# Patient Record
Sex: Female | Born: 1935 | Race: Black or African American | Hispanic: No | Smoking: Former smoker
Health system: Southern US, Community
[De-identification: ages and names within clinical notes are randomized; demographics above are authoritative.]

## PROBLEM LIST (undated history)

## (undated) DIAGNOSIS — Z8619 Personal history of other infectious and parasitic diseases: Secondary | ICD-10-CM

## (undated) DIAGNOSIS — I10 Essential (primary) hypertension: Secondary | ICD-10-CM

## (undated) DIAGNOSIS — K5792 Diverticulitis of intestine, part unspecified, without perforation or abscess without bleeding: Secondary | ICD-10-CM

## (undated) DIAGNOSIS — E7800 Pure hypercholesterolemia, unspecified: Secondary | ICD-10-CM

## (undated) DIAGNOSIS — Z974 Presence of external hearing-aid: Secondary | ICD-10-CM

## (undated) DIAGNOSIS — J349 Unspecified disorder of nose and nasal sinuses: Secondary | ICD-10-CM

## (undated) DIAGNOSIS — IMO0001 Reserved for inherently not codable concepts without codable children: Secondary | ICD-10-CM

## (undated) DIAGNOSIS — Z972 Presence of dental prosthetic device (complete) (partial): Secondary | ICD-10-CM

## (undated) DIAGNOSIS — K219 Gastro-esophageal reflux disease without esophagitis: Secondary | ICD-10-CM

## (undated) DIAGNOSIS — M8580 Other specified disorders of bone density and structure, unspecified site: Secondary | ICD-10-CM

## (undated) DIAGNOSIS — E119 Type 2 diabetes mellitus without complications: Secondary | ICD-10-CM

## (undated) DIAGNOSIS — B019 Varicella without complication: Secondary | ICD-10-CM

## (undated) DIAGNOSIS — D649 Anemia, unspecified: Secondary | ICD-10-CM

## (undated) DIAGNOSIS — M1990 Unspecified osteoarthritis, unspecified site: Secondary | ICD-10-CM

## (undated) HISTORY — PX: EYE SURGERY: SHX253

## (undated) HISTORY — PX: COLONOSCOPY: SHX174

## (undated) HISTORY — PX: BREAST SURGERY: SHX581

## (undated) HISTORY — PX: ABDOMINAL HYSTERECTOMY: SHX81

## (undated) HISTORY — PX: CHOLECYSTECTOMY: SHX55

## (undated) HISTORY — PX: BREAST BIOPSY: SHX20

---

## 2004-06-12 ENCOUNTER — Ambulatory Visit: Payer: Self-pay | Admitting: Internal Medicine

## 2004-11-10 ENCOUNTER — Ambulatory Visit: Payer: Self-pay

## 2005-06-16 ENCOUNTER — Ambulatory Visit: Payer: Self-pay | Admitting: Internal Medicine

## 2006-06-21 ENCOUNTER — Ambulatory Visit: Payer: Self-pay | Admitting: Internal Medicine

## 2006-06-22 ENCOUNTER — Ambulatory Visit: Payer: Self-pay | Admitting: Internal Medicine

## 2006-07-09 ENCOUNTER — Ambulatory Visit: Payer: Self-pay | Admitting: Internal Medicine

## 2007-07-25 ENCOUNTER — Ambulatory Visit: Payer: Self-pay | Admitting: Internal Medicine

## 2008-07-23 ENCOUNTER — Ambulatory Visit: Payer: Self-pay | Admitting: Internal Medicine

## 2008-08-13 ENCOUNTER — Ambulatory Visit: Payer: Self-pay | Admitting: Internal Medicine

## 2009-05-14 ENCOUNTER — Ambulatory Visit: Payer: Self-pay | Admitting: Internal Medicine

## 2009-06-08 ENCOUNTER — Ambulatory Visit: Payer: Self-pay | Admitting: Internal Medicine

## 2009-08-15 ENCOUNTER — Ambulatory Visit: Payer: Self-pay | Admitting: Internal Medicine

## 2010-03-03 ENCOUNTER — Ambulatory Visit: Payer: Self-pay | Admitting: Gastroenterology

## 2010-08-19 ENCOUNTER — Ambulatory Visit: Payer: Self-pay | Admitting: Internal Medicine

## 2010-08-21 ENCOUNTER — Ambulatory Visit: Payer: Self-pay | Admitting: Internal Medicine

## 2011-02-24 ENCOUNTER — Ambulatory Visit: Payer: Self-pay | Admitting: Internal Medicine

## 2011-08-20 ENCOUNTER — Ambulatory Visit: Payer: Self-pay | Admitting: Internal Medicine

## 2012-08-24 ENCOUNTER — Ambulatory Visit: Payer: Self-pay | Admitting: Internal Medicine

## 2012-10-14 ENCOUNTER — Ambulatory Visit: Payer: Self-pay | Admitting: Family Medicine

## 2013-08-25 ENCOUNTER — Ambulatory Visit: Payer: Self-pay | Admitting: Internal Medicine

## 2014-08-24 ENCOUNTER — Ambulatory Visit: Payer: Self-pay | Admitting: Family Medicine

## 2014-08-28 ENCOUNTER — Ambulatory Visit: Payer: Self-pay | Admitting: Internal Medicine

## 2015-01-14 NOTE — Discharge Instructions (Signed)

## 2015-01-16 ENCOUNTER — Ambulatory Visit: Payer: Medicare Other | Admitting: Anesthesiology

## 2015-01-16 ENCOUNTER — Encounter: Payer: Self-pay | Admitting: *Deleted

## 2015-01-16 ENCOUNTER — Encounter
Admission: RE | Disposition: A | Discharge status: Home / Self Care | Payer: Self-pay | Source: Ambulatory Visit | Attending: Ophthalmology

## 2015-01-16 ENCOUNTER — Ambulatory Visit
Admission: RE | Admit: 2015-01-16 | Discharge: 2015-01-16 | Disposition: A | Discharge status: Home / Self Care | Payer: Medicare Other | Source: Ambulatory Visit | Attending: Ophthalmology | Admitting: Ophthalmology

## 2015-01-16 DIAGNOSIS — K5790 Diverticulosis of intestine, part unspecified, without perforation or abscess without bleeding: Secondary | ICD-10-CM | POA: Diagnosis not present

## 2015-01-16 DIAGNOSIS — M1990 Unspecified osteoarthritis, unspecified site: Secondary | ICD-10-CM | POA: Diagnosis not present

## 2015-01-16 DIAGNOSIS — R0602 Shortness of breath: Secondary | ICD-10-CM | POA: Insufficient documentation

## 2015-01-16 DIAGNOSIS — E780 Pure hypercholesterolemia: Secondary | ICD-10-CM | POA: Diagnosis not present

## 2015-01-16 DIAGNOSIS — Z885 Allergy status to narcotic agent status: Secondary | ICD-10-CM | POA: Insufficient documentation

## 2015-01-16 DIAGNOSIS — Z87891 Personal history of nicotine dependence: Secondary | ICD-10-CM | POA: Insufficient documentation

## 2015-01-16 DIAGNOSIS — Z90710 Acquired absence of both cervix and uterus: Secondary | ICD-10-CM | POA: Insufficient documentation

## 2015-01-16 DIAGNOSIS — D649 Anemia, unspecified: Secondary | ICD-10-CM | POA: Insufficient documentation

## 2015-01-16 DIAGNOSIS — H2511 Age-related nuclear cataract, right eye: Secondary | ICD-10-CM | POA: Insufficient documentation

## 2015-01-16 DIAGNOSIS — E119 Type 2 diabetes mellitus without complications: Secondary | ICD-10-CM | POA: Diagnosis not present

## 2015-01-16 DIAGNOSIS — I10 Essential (primary) hypertension: Secondary | ICD-10-CM | POA: Diagnosis not present

## 2015-01-16 DIAGNOSIS — K219 Gastro-esophageal reflux disease without esophagitis: Secondary | ICD-10-CM | POA: Diagnosis not present

## 2015-01-16 DIAGNOSIS — Z882 Allergy status to sulfonamides status: Secondary | ICD-10-CM | POA: Insufficient documentation

## 2015-01-16 DIAGNOSIS — Z9049 Acquired absence of other specified parts of digestive tract: Secondary | ICD-10-CM | POA: Diagnosis not present

## 2015-01-16 HISTORY — DX: Type 2 diabetes mellitus without complications: E11.9

## 2015-01-16 HISTORY — DX: Pure hypercholesterolemia, unspecified: E78.00

## 2015-01-16 HISTORY — DX: Essential (primary) hypertension: I10

## 2015-01-16 HISTORY — DX: Unspecified disorder of nose and nasal sinuses: J34.9

## 2015-01-16 HISTORY — DX: Gastro-esophageal reflux disease without esophagitis: K21.9

## 2015-01-16 HISTORY — DX: Unspecified osteoarthritis, unspecified site: M19.90

## 2015-01-16 HISTORY — DX: Reserved for inherently not codable concepts without codable children: IMO0001

## 2015-01-16 HISTORY — DX: Diverticulitis of intestine, part unspecified, without perforation or abscess without bleeding: K57.92

## 2015-01-16 HISTORY — PX: CATARACT EXTRACTION W/PHACO: SHX586

## 2015-01-16 HISTORY — DX: Anemia, unspecified: D64.9

## 2015-01-16 LAB — GLUCOSE, CAPILLARY
GLUCOSE-CAPILLARY: 123 mg/dL — AB (ref 65–99)
Glucose-Capillary: 132 mg/dL — ABNORMAL HIGH (ref 65–99)

## 2015-01-16 SURGERY — PHACOEMULSIFICATION, CATARACT, WITH IOL INSERTION
Anesthesia: Monitor Anesthesia Care | Laterality: Right | Wound class: Clean

## 2015-01-16 MED ORDER — TIMOLOL MALEATE 0.5 % OP SOLN
OPHTHALMIC | Status: DC | PRN
  Administered 2015-01-16: 1 [drp] via OPHTHALMIC

## 2015-01-16 MED ORDER — BRIMONIDINE TARTRATE 0.2 % OP SOLN
OPHTHALMIC | Status: DC | PRN
  Administered 2015-01-16: 1 [drp] via OPHTHALMIC

## 2015-01-16 MED ORDER — ARMC OPHTHALMIC DILATING GEL
1.0000 "application " | OPHTHALMIC | Status: DC | PRN
  Administered 2015-01-16 (×2): 1 via OPHTHALMIC

## 2015-01-16 MED ORDER — TETRACAINE HCL 0.5 % OP SOLN
1.0000 [drp] | OPHTHALMIC | Status: DC | PRN
  Administered 2015-01-16: 1 [drp] via OPHTHALMIC

## 2015-01-16 MED ORDER — MIDAZOLAM HCL 2 MG/2ML IJ SOLN
INTRAMUSCULAR | Status: DC | PRN
  Administered 2015-01-16: 2 mg via INTRAVENOUS

## 2015-01-16 MED ORDER — EPINEPHRINE HCL 1 MG/ML IJ SOLN
INTRAMUSCULAR | Status: DC | PRN
  Administered 2015-01-16: 86 mL via OPHTHALMIC

## 2015-01-16 MED ORDER — FENTANYL CITRATE (PF) 100 MCG/2ML IJ SOLN
INTRAMUSCULAR | Status: DC | PRN
  Administered 2015-01-16: 50 ug via INTRAVENOUS

## 2015-01-16 MED ORDER — CEFUROXIME OPHTHALMIC INJECTION 1 MG/0.1 ML
INJECTION | OPHTHALMIC | Status: DC | PRN
  Administered 2015-01-16: .3 mL via INTRACAMERAL

## 2015-01-16 MED ORDER — NA HYALUR & NA CHOND-NA HYALUR 0.4-0.35 ML IO KIT
PACK | INTRAOCULAR | Status: DC | PRN
  Administered 2015-01-16: 1 mL via INTRAOCULAR

## 2015-01-16 MED ORDER — POVIDONE-IODINE 5 % OP SOLN
1.0000 "application " | OPHTHALMIC | Status: DC | PRN
  Administered 2015-01-16: 1 via OPHTHALMIC

## 2015-01-16 SURGICAL SUPPLY — 26 items
CANNULA ANT/CHMB 27GA (MISCELLANEOUS) ×3 IMPLANT
GLOVE SURG LX 7.5 STRW (GLOVE) ×2
GLOVE SURG LX STRL 7.5 STRW (GLOVE) ×1 IMPLANT
GLOVE SURG TRIUMPH 8.0 PF LTX (GLOVE) ×3 IMPLANT
GOWN STRL REUS W/ TWL LRG LVL3 (GOWN DISPOSABLE) ×2 IMPLANT
GOWN STRL REUS W/TWL LRG LVL3 (GOWN DISPOSABLE) ×4
LENS IOL TECNIS 24.5 (Intraocular Lens) ×3 IMPLANT
LENS IOL TECNIS MONO 1P 24.5 (Intraocular Lens) ×1 IMPLANT
MARKER SKIN SURG W/RULER VIO (MISCELLANEOUS) ×3 IMPLANT
NDL RETROBULBAR .5 NSTRL (NEEDLE) IMPLANT
NEEDLE FILTER BLUNT 18X 1/2SAF (NEEDLE) ×2
NEEDLE FILTER BLUNT 18X1 1/2 (NEEDLE) ×1 IMPLANT
PACK CATARACT BRASINGTON (MISCELLANEOUS) ×3 IMPLANT
PACK EYE AFTER SURG (MISCELLANEOUS) ×3 IMPLANT
PACK OPTHALMIC (MISCELLANEOUS) ×3 IMPLANT
RING MALYGIN 7.0 (MISCELLANEOUS) IMPLANT
SUT ETHILON 10-0 CS-B-6CS-B-6 (SUTURE)
SUT VICRYL  9 0 (SUTURE)
SUT VICRYL 9 0 (SUTURE) IMPLANT
SUTURE EHLN 10-0 CS-B-6CS-B-6 (SUTURE) IMPLANT
SYR 3ML LL SCALE MARK (SYRINGE) ×3 IMPLANT
SYR 5ML LL (SYRINGE) IMPLANT
SYR TB 1ML LUER SLIP (SYRINGE) ×3 IMPLANT
WATER STERILE IRR 250ML POUR (IV SOLUTION) ×3 IMPLANT
WATER STERILE IRR 500ML POUR (IV SOLUTION) IMPLANT
WIPE NON LINTING 3.25X3.25 (MISCELLANEOUS) ×3 IMPLANT

## 2015-01-16 NOTE — Anesthesia Preprocedure Evaluation (Signed)
Anesthesia Evaluation   Patient awake    Reviewed: Allergy & Precautions, H&P , NPO status , Patient's Chart, lab work & pertinent test results  History of Anesthesia Complications Negative for: history of anesthetic complications  Airway Mallampati: II  TM Distance: >3 FB Neck ROM: full    Dental no notable dental hx.    Pulmonary former smoker,    Pulmonary exam normal       Cardiovascular hypertension, On Medications Normal cardiovascular exam    Neuro/Psych    GI/Hepatic Neg liver ROS, GERD-  Medicated,  Endo/Other  diabetes  Renal/GU negative Renal ROS     Musculoskeletal   Abdominal   Peds  Hematology negative hematology ROS (+)   Anesthesia Other Findings   Reproductive/Obstetrics                             Anesthesia Physical Anesthesia Plan  ASA: II  Anesthesia Plan: MAC   Post-op Pain Management:    Induction:   Airway Management Planned:   Additional Equipment:   Intra-op Plan:   Post-operative Plan:   Informed Consent: I have reviewed the patients History and Physical, chart, labs and discussed the procedure including the risks, benefits and alternatives for the proposed anesthesia with the patient or authorized representative who has indicated his/her understanding and acceptance.     Plan Discussed with: CRNA  Anesthesia Plan Comments:         Anesthesia Quick Evaluation

## 2015-01-16 NOTE — Op Note (Signed)
LOCATION:  [place]   PREOPERATIVE DIAGNOSIS:    Nuclear sclerotic cataract right eye. H25.11   POSTOPERATIVE DIAGNOSIS:  Nuclear sclerotic cataract right eye.     PROCEDURE:  Phacoemusification with posterior chamber intraocular lens placement of the right eye   LENS:   Implant Name Type Inv. Item Serial No. Manufacturer Lot No. LRB No. Used  LENS IMPL INTRAOC ZCB00 24.5 - [id number] Intraocular Lens LENS IMPL INTRAOC ZCB00 24.5 [id number] AMO   Right 1        ULTRASOUND TIME: 14 % of 1 minutes, 32 seconds.  CDE 12.9   SURGEON:  Wyonia Hough, MD   ANESTHESIA:  Topical with tetracaine drops and 2% Xylocaine jelly.   COMPLICATIONS:  None.   DESCRIPTION OF PROCEDURE:  The patient was identified in the holding room and transported to the operating room and placed in the supine position under the operating microscope.  The right eye was identified as the operative eye and it was prepped and draped in the usual sterile ophthalmic fashion.   A 1 millimeter clear-corneal paracentesis was made at the 12:00 position.  The anterior chamber was filled with Viscoat viscoelastic.  A 2.4 millimeter keratome was used to make a near-clear corneal incision at the 9:00 position.  A curvilinear capsulorrhexis was made with a cystotome and capsulorrhexis forceps.  Balanced salt solution was used to hydrodissect and hydrodelineate the nucleus.   Phacoemulsification was then used in stop and chop fashion to remove the lens nucleus and epinucleus.  The remaining cortex was then removed using the irrigation and aspiration handpiece. Provisc was then placed into the capsular bag to distend it for lens placement.  A lens was then injected into the capsular bag.  The remaining viscoelastic was aspirated.   Wounds were hydrated with balanced salt solution.  The anterior chamber was inflated to a physiologic pressure with balanced salt solution.  No wound leaks were noted. Cefuroxime 0.1 ml of a  10mg /ml solution was injected into the anterior chamber for a dose of 1 mg of intracameral antibiotic at the completion of the case.   Timolol and Brimonidine drops were applied to the eye.  The patient was taken to the recovery room in stable condition without complications of anesthesia or surgery.   Jean Skow 01/16/2015, 8:32 AM

## 2015-01-16 NOTE — Anesthesia Postprocedure Evaluation (Signed)
  Anesthesia Post-op Note  Patient: Gina Carney  Procedure(s) Performed: Procedure(s) with comments: CATARACT EXTRACTION PHACO AND INTRAOCULAR LENS PLACEMENT (IOC) (Right) - DIABETIC-oral meds  Anesthesia type:MAC  Patient location: PACU  Post pain: Pain level controlled  Post assessment: Post-op Vital signs reviewed, Patient's Cardiovascular Status Stable, Respiratory Function Stable, Patent Airway and No signs of Nausea or vomiting  Post vital signs: Reviewed and stable  Last Vitals:  Filed Vitals:   01/16/15 0834  BP:   Pulse:   Temp: 36.5 C  Resp:     Level of consciousness: awake, alert  and patient cooperative  Complications: No apparent anesthesia complications

## 2015-01-16 NOTE — Transfer of Care (Signed)
Immediate Anesthesia Transfer of Care Note  Patient: Gina Carney  Procedure(s) Performed: Procedure(s) with comments: CATARACT EXTRACTION PHACO AND INTRAOCULAR LENS PLACEMENT (IOC) (Right) - DIABETIC-oral meds  Patient Location: PACU  Anesthesia Type: MAC  Level of Consciousness: awake, alert  and patient cooperative  Airway and Oxygen Therapy: Patient Spontanous Breathing and Patient connected to supplemental oxygen  Post-op Assessment: Post-op Vital signs reviewed, Patient's Cardiovascular Status Stable, Respiratory Function Stable, Patent Airway and No signs of Nausea or vomiting  Post-op Vital Signs: Reviewed and stable  Complications: No apparent anesthesia complications

## 2015-01-16 NOTE — H&P (Signed)
  The History and Physical notes were scanned in.  The patient remains stable and unchanged from the H&P.   Previous H&P reviewed, patient examined, and there are no changes.  Gina Carney 01/16/2015 8:06 AM

## 2015-01-16 NOTE — Anesthesia Procedure Notes (Signed)
Procedure Name: MAC Performed by: Sherman Donaldson Pre-anesthesia Checklist: Patient identified, Emergency Drugs available, Suction available, Timeout performed and Patient being monitored Patient Re-evaluated:Patient Re-evaluated prior to inductionOxygen Delivery Method: Nasal cannula Placement Confirmation: positive ETCO2     

## 2015-01-17 ENCOUNTER — Encounter: Payer: Self-pay | Admitting: Ophthalmology

## 2015-02-13 ENCOUNTER — Encounter: Payer: Self-pay | Admitting: *Deleted

## 2015-02-18 NOTE — Discharge Instructions (Signed)

## 2015-02-20 ENCOUNTER — Ambulatory Visit
Admission: RE | Admit: 2015-02-20 | Discharge: 2015-02-20 | Disposition: A | Discharge status: Home / Self Care | Payer: Medicare Other | Source: Ambulatory Visit | Attending: Ophthalmology | Admitting: Ophthalmology

## 2015-02-20 ENCOUNTER — Ambulatory Visit: Payer: Medicare Other | Admitting: Anesthesiology

## 2015-02-20 ENCOUNTER — Encounter
Admission: RE | Disposition: A | Discharge status: Home / Self Care | Payer: Self-pay | Source: Ambulatory Visit | Attending: Ophthalmology

## 2015-02-20 DIAGNOSIS — Z90710 Acquired absence of both cervix and uterus: Secondary | ICD-10-CM | POA: Diagnosis not present

## 2015-02-20 DIAGNOSIS — M1990 Unspecified osteoarthritis, unspecified site: Secondary | ICD-10-CM | POA: Insufficient documentation

## 2015-02-20 DIAGNOSIS — Z91018 Allergy to other foods: Secondary | ICD-10-CM | POA: Diagnosis not present

## 2015-02-20 DIAGNOSIS — Z885 Allergy status to narcotic agent status: Secondary | ICD-10-CM | POA: Insufficient documentation

## 2015-02-20 DIAGNOSIS — Z9049 Acquired absence of other specified parts of digestive tract: Secondary | ICD-10-CM | POA: Diagnosis not present

## 2015-02-20 DIAGNOSIS — D649 Anemia, unspecified: Secondary | ICD-10-CM | POA: Diagnosis not present

## 2015-02-20 DIAGNOSIS — E119 Type 2 diabetes mellitus without complications: Secondary | ICD-10-CM | POA: Insufficient documentation

## 2015-02-20 DIAGNOSIS — Z9841 Cataract extraction status, right eye: Secondary | ICD-10-CM | POA: Diagnosis not present

## 2015-02-20 DIAGNOSIS — R0602 Shortness of breath: Secondary | ICD-10-CM | POA: Diagnosis not present

## 2015-02-20 DIAGNOSIS — E780 Pure hypercholesterolemia: Secondary | ICD-10-CM | POA: Insufficient documentation

## 2015-02-20 DIAGNOSIS — Z87891 Personal history of nicotine dependence: Secondary | ICD-10-CM | POA: Insufficient documentation

## 2015-02-20 DIAGNOSIS — H2512 Age-related nuclear cataract, left eye: Secondary | ICD-10-CM | POA: Diagnosis not present

## 2015-02-20 HISTORY — PX: CATARACT EXTRACTION W/PHACO: SHX586

## 2015-02-20 LAB — GLUCOSE, CAPILLARY
GLUCOSE-CAPILLARY: 153 mg/dL — AB (ref 65–99)
Glucose-Capillary: 156 mg/dL — ABNORMAL HIGH (ref 65–99)

## 2015-02-20 SURGERY — PHACOEMULSIFICATION, CATARACT, WITH IOL INSERTION
Anesthesia: Monitor Anesthesia Care | Laterality: Left

## 2015-02-20 MED ORDER — MIDAZOLAM HCL 2 MG/2ML IJ SOLN
INTRAMUSCULAR | Status: DC | PRN
  Administered 2015-02-20: 1 mg via INTRAVENOUS

## 2015-02-20 MED ORDER — LACTATED RINGERS IV SOLN
INTRAVENOUS | Status: DC
Start: 2015-02-20 — End: 2015-02-20

## 2015-02-20 MED ORDER — LACTATED RINGERS IV SOLN: 500.0000 mL | INTRAVENOUS | Status: DC

## 2015-02-20 MED ORDER — CEFUROXIME OPHTHALMIC INJECTION 1 MG/0.1 ML
INJECTION | OPHTHALMIC | Status: DC | PRN
  Administered 2015-02-20: 0.1 mL via INTRACAMERAL

## 2015-02-20 MED ORDER — PROPARACAINE HCL 0.5 % OP SOLN
1.0000 [drp] | Freq: Once | OPHTHALMIC | Status: AC
  Administered 2015-02-20: 1 [drp] via OPHTHALMIC

## 2015-02-20 MED ORDER — TIMOLOL MALEATE 0.5 % OP SOLN
OPHTHALMIC | Status: DC | PRN
  Administered 2015-02-20: 1 [drp] via OPHTHALMIC

## 2015-02-20 MED ORDER — NA HYALUR & NA CHOND-NA HYALUR 0.4-0.35 ML IO KIT
PACK | INTRAOCULAR | Status: DC | PRN
  Administered 2015-02-20: 1 mL via INTRAOCULAR

## 2015-02-20 MED ORDER — ARMC OPHTHALMIC DILATING GEL
1.0000 "application " | OPHTHALMIC | Status: DC | PRN
  Administered 2015-02-20 (×2): 1 via OPHTHALMIC

## 2015-02-20 MED ORDER — BRIMONIDINE TARTRATE 0.2 % OP SOLN
OPHTHALMIC | Status: DC | PRN
  Administered 2015-02-20: 1 [drp] via OPHTHALMIC

## 2015-02-20 MED ORDER — TETRACAINE HCL 0.5 % OP SOLN
OPHTHALMIC | Status: DC | PRN
  Administered 2015-02-20: 2 [drp] via OPHTHALMIC

## 2015-02-20 MED ORDER — FENTANYL CITRATE (PF) 100 MCG/2ML IJ SOLN
INTRAMUSCULAR | Status: DC | PRN
  Administered 2015-02-20 (×2): 25 ug via INTRAVENOUS

## 2015-02-20 MED ORDER — POVIDONE-IODINE 5 % OP SOLN
1.0000 "application " | OPHTHALMIC | Status: DC | PRN
  Administered 2015-02-20: 1 via OPHTHALMIC

## 2015-02-20 MED ORDER — EPINEPHRINE HCL 1 MG/ML IJ SOLN
INTRAOCULAR | Status: DC | PRN
  Administered 2015-02-20: 75 mL via OPHTHALMIC

## 2015-02-20 SURGICAL SUPPLY — 26 items
CANNULA ANT/CHMB 27GA (MISCELLANEOUS) ×3 IMPLANT
GLOVE SURG LX 7.5 STRW (GLOVE) ×2
GLOVE SURG LX STRL 7.5 STRW (GLOVE) ×1 IMPLANT
GLOVE SURG TRIUMPH 8.0 PF LTX (GLOVE) ×3 IMPLANT
GOWN STRL REUS W/ TWL LRG LVL3 (GOWN DISPOSABLE) ×2 IMPLANT
GOWN STRL REUS W/TWL LRG LVL3 (GOWN DISPOSABLE) ×4
LENS IOL TECNIS 24.5 (Intraocular Lens) ×3 IMPLANT
LENS IOL TECNIS MONO 1P 24.5 (Intraocular Lens) ×1 IMPLANT
MARKER SKIN SURG W/RULER VIO (MISCELLANEOUS) ×3 IMPLANT
NDL RETROBULBAR .5 NSTRL (NEEDLE) IMPLANT
NEEDLE FILTER BLUNT 18X 1/2SAF (NEEDLE) ×2
NEEDLE FILTER BLUNT 18X1 1/2 (NEEDLE) ×1 IMPLANT
PACK CATARACT BRASINGTON (MISCELLANEOUS) ×3 IMPLANT
PACK EYE AFTER SURG (MISCELLANEOUS) ×3 IMPLANT
PACK OPTHALMIC (MISCELLANEOUS) ×3 IMPLANT
RING MALYGIN 7.0 (MISCELLANEOUS) IMPLANT
SUT ETHILON 10-0 CS-B-6CS-B-6 (SUTURE)
SUT VICRYL  9 0 (SUTURE)
SUT VICRYL 9 0 (SUTURE) IMPLANT
SUTURE EHLN 10-0 CS-B-6CS-B-6 (SUTURE) IMPLANT
SYR 3ML LL SCALE MARK (SYRINGE) ×3 IMPLANT
SYR 5ML LL (SYRINGE) IMPLANT
SYR TB 1ML LUER SLIP (SYRINGE) ×3 IMPLANT
WATER STERILE IRR 250ML POUR (IV SOLUTION) ×3 IMPLANT
WATER STERILE IRR 500ML POUR (IV SOLUTION) IMPLANT
WIPE NON LINTING 3.25X3.25 (MISCELLANEOUS) ×3 IMPLANT

## 2015-02-20 NOTE — Anesthesia Preprocedure Evaluation (Addendum)
Anesthesia Evaluation  Patient identified by MRN, date of birth, ID band Patient awake    Reviewed: Allergy & Precautions, H&P , NPO status , Patient's Chart, lab work & pertinent test results, reviewed documented beta blocker date and time   Airway Mallampati: II  TM Distance: >3 FB Neck ROM: full    Dental  (+) Upper Dentures, Lower Dentures   Pulmonary shortness of breath and with exertion, former smoker,    Pulmonary exam normal breath sounds clear to auscultation       Cardiovascular Exercise Tolerance: Good hypertension,  Rhythm:regular Rate:Normal     Neuro/Psych negative neurological ROS  negative psych ROS   GI/Hepatic Neg liver ROS, GERD  Medicated,  Endo/Other  diabetes, Well Controlled, Type 2  Renal/GU negative Renal ROS  negative genitourinary   Musculoskeletal   Abdominal   Peds  Hematology  (+) anemia ,   Anesthesia Other Findings   Reproductive/Obstetrics negative OB ROS                            Anesthesia Physical Anesthesia Plan  ASA: III  Anesthesia Plan: MAC   Post-op Pain Management:    Induction:   Airway Management Planned:   Additional Equipment:   Intra-op Plan:   Post-operative Plan:   Informed Consent: I have reviewed the patients History and Physical, chart, labs and discussed the procedure including the risks, benefits and alternatives for the proposed anesthesia with the patient or authorized representative who has indicated his/her understanding and acceptance.     Plan Discussed with: CRNA  Anesthesia Plan Comments:         Anesthesia Quick Evaluation

## 2015-02-20 NOTE — Op Note (Signed)
OPERATIVE NOTE  DENEE BOEDER [id number] 02/20/2015   PREOPERATIVE DIAGNOSIS:  Nuclear sclerotic cataract left eye. H25.12   POSTOPERATIVE DIAGNOSIS:    Nuclear sclerotic cataract left eye.     PROCEDURE:  Phacoemusification with posterior chamber intraocular lens placement of the left eye   LENS:   Implant Name Type Inv. Item Serial No. Manufacturer Lot No. LRB No. Used  LENS IMPL INTRAOC ZCB00 24.5 - [id number] Intraocular Lens LENS IMPL INTRAOC ZCB00 24.5 [id number] AMO   Left 1        ULTRASOUND TIME: 16  % of 1 minutes 16 seconds, CDE 12.5  SURGEON:  Wyonia Hough, MD   ANESTHESIA:  Topical with tetracaine drops and 2% Xylocaine jelly.   COMPLICATIONS:  None.   DESCRIPTION OF PROCEDURE:  The patient was identified in the holding room and transported to the operating room and placed in the supine position under the operating microscope.  The left eye was identified as the operative eye and it was prepped and draped in the usual sterile ophthalmic fashion.   A 1 millimeter clear-corneal paracentesis was made at the 1:30 position.  The anterior chamber was filled with Viscoat viscoelastic.  A 2.4 millimeter keratome was used to make a near-clear corneal incision at the 10:30 position.  .  A curvilinear capsulorrhexis was made with a cystotome and capsulorrhexis forceps.  Balanced salt solution was used to hydrodissect and hydrodelineate the nucleus.   Phacoemulsification was then used in stop and chop fashion to remove the lens nucleus and epinucleus.  The remaining cortex was then removed using the irrigation and aspiration handpiece. Provisc was then placed into the capsular bag to distend it for lens placement.  A lens was then injected into the capsular bag.  The remaining viscoelastic was aspirated.   Wounds were hydrated with balanced salt solution.  The anterior chamber was inflated to a physiologic pressure with balanced salt solution.  No wound leaks were noted.  Cefuroxime 0.1 ml of a 10mg /ml solution was injected into the anterior chamber for a dose of 1 mg of intracameral antibiotic at the completion of the case.   Timolol and Brimonidine drops were applied to the eye.  The patient was taken to the recovery room in stable condition without complications of anesthesia or surgery.  Aiyanna Awtrey 02/20/2015, 7:58 AM

## 2015-02-20 NOTE — H&P (Signed)
  The History and Physical notes were scanned in.  The patient remains stable and unchanged from the H&P, except for the addition of PO Claritin.  Previous H&P reviewed, patient examined, and there are no changes.  Lizeth Bencosme 02/20/2015 7:34 AM

## 2015-02-20 NOTE — Anesthesia Postprocedure Evaluation (Signed)
  Anesthesia Post-op Note  Patient: Gina Carney  Procedure(s) Performed: Procedure(s) with comments: CATARACT EXTRACTION PHACO AND INTRAOCULAR LENS PLACEMENT (IOC) (Left) - DIABETIC - oral meds  Anesthesia type:MAC  Patient location: PACU  Post pain: Pain level controlled  Post assessment: Post-op Vital signs reviewed, Patient's Cardiovascular Status Stable, Respiratory Function Stable, Patent Airway and No signs of Nausea or vomiting  Post vital signs: Reviewed and stable  Last Vitals:  Filed Vitals:   02/20/15 0809  BP: 117/64  Pulse: 77  Temp: 36.4 C  Resp: 14    Level of consciousness: awake, alert  and patient cooperative  Complications: No apparent anesthesia complications

## 2015-02-20 NOTE — Anesthesia Procedure Notes (Signed)
Procedure Name: [place] Performed by: Nat Christen

## 2015-02-20 NOTE — Transfer of Care (Signed)
Immediate Anesthesia Transfer of Care Note  Patient: Gina Carney  Procedure(s) Performed: Procedure(s) with comments: CATARACT EXTRACTION PHACO AND INTRAOCULAR LENS PLACEMENT (IOC) (Left) - DIABETIC - oral meds  Patient Location: PACU  Anesthesia Type: MAC  Level of Consciousness: awake, alert  and patient cooperative  Airway and Oxygen Therapy: Patient Spontanous Breathing and Patient connected to supplemental oxygen  Post-op Assessment: Post-op Vital signs reviewed, Patient's Cardiovascular Status Stable, Respiratory Function Stable, Patent Airway and No signs of Nausea or vomiting  Post-op Vital Signs: Reviewed and stable  Complications: No apparent anesthesia complications

## 2015-02-21 ENCOUNTER — Encounter: Payer: Self-pay | Admitting: Ophthalmology

## 2015-07-15 ENCOUNTER — Other Ambulatory Visit: Payer: Self-pay | Admitting: Internal Medicine

## 2015-07-15 DIAGNOSIS — Z1231 Encounter for screening mammogram for malignant neoplasm of breast: Secondary | ICD-10-CM

## 2015-08-29 ENCOUNTER — Ambulatory Visit
Admission: RE | Admit: 2015-08-29 | Discharge: 2015-08-29 | Disposition: A | Discharge status: Home / Self Care | Payer: Medicare Other | Source: Ambulatory Visit | Attending: Internal Medicine | Admitting: Internal Medicine

## 2015-08-29 ENCOUNTER — Ambulatory Visit: Payer: PRIVATE HEALTH INSURANCE

## 2015-08-29 DIAGNOSIS — Z1231 Encounter for screening mammogram for malignant neoplasm of breast: Secondary | ICD-10-CM | POA: Insufficient documentation

## 2015-10-10 ENCOUNTER — Encounter: Payer: Self-pay | Admitting: *Deleted

## 2015-10-11 ENCOUNTER — Ambulatory Visit: Payer: Medicare Other | Admitting: Certified Registered Nurse Anesthetist

## 2015-10-11 ENCOUNTER — Encounter
Admission: RE | Disposition: A | Discharge status: Home / Self Care | Payer: Self-pay | Source: Ambulatory Visit | Attending: Gastroenterology

## 2015-10-11 ENCOUNTER — Ambulatory Visit
Admission: RE | Admit: 2015-10-11 | Discharge: 2015-10-11 | Disposition: A | Discharge status: Home / Self Care | Payer: Medicare Other | Source: Ambulatory Visit | Attending: Gastroenterology | Admitting: Gastroenterology

## 2015-10-11 DIAGNOSIS — E119 Type 2 diabetes mellitus without complications: Secondary | ICD-10-CM | POA: Insufficient documentation

## 2015-10-11 DIAGNOSIS — I10 Essential (primary) hypertension: Secondary | ICD-10-CM | POA: Diagnosis not present

## 2015-10-11 DIAGNOSIS — M170 Bilateral primary osteoarthritis of knee: Secondary | ICD-10-CM | POA: Insufficient documentation

## 2015-10-11 DIAGNOSIS — Z1211 Encounter for screening for malignant neoplasm of colon: Secondary | ICD-10-CM | POA: Insufficient documentation

## 2015-10-11 DIAGNOSIS — Z7984 Long term (current) use of oral hypoglycemic drugs: Secondary | ICD-10-CM | POA: Diagnosis not present

## 2015-10-11 DIAGNOSIS — D124 Benign neoplasm of descending colon: Secondary | ICD-10-CM | POA: Diagnosis not present

## 2015-10-11 DIAGNOSIS — K219 Gastro-esophageal reflux disease without esophagitis: Secondary | ICD-10-CM | POA: Diagnosis not present

## 2015-10-11 DIAGNOSIS — E7800 Pure hypercholesterolemia, unspecified: Secondary | ICD-10-CM | POA: Diagnosis not present

## 2015-10-11 DIAGNOSIS — D123 Benign neoplasm of transverse colon: Secondary | ICD-10-CM | POA: Diagnosis not present

## 2015-10-11 DIAGNOSIS — K648 Other hemorrhoids: Secondary | ICD-10-CM | POA: Diagnosis not present

## 2015-10-11 DIAGNOSIS — Z79899 Other long term (current) drug therapy: Secondary | ICD-10-CM | POA: Diagnosis not present

## 2015-10-11 DIAGNOSIS — K644 Residual hemorrhoidal skin tags: Secondary | ICD-10-CM | POA: Insufficient documentation

## 2015-10-11 DIAGNOSIS — K5730 Diverticulosis of large intestine without perforation or abscess without bleeding: Secondary | ICD-10-CM | POA: Insufficient documentation

## 2015-10-11 DIAGNOSIS — Z7982 Long term (current) use of aspirin: Secondary | ICD-10-CM | POA: Insufficient documentation

## 2015-10-11 HISTORY — DX: Other specified disorders of bone density and structure, unspecified site: M85.80

## 2015-10-11 HISTORY — DX: Personal history of other infectious and parasitic diseases: Z86.19

## 2015-10-11 HISTORY — DX: Varicella without complication: B01.9

## 2015-10-11 HISTORY — PX: COLONOSCOPY WITH PROPOFOL: SHX5780

## 2015-10-11 LAB — GLUCOSE, CAPILLARY: GLUCOSE-CAPILLARY: 155 mg/dL — AB (ref 65–99)

## 2015-10-11 SURGERY — COLONOSCOPY WITH PROPOFOL
Anesthesia: General

## 2015-10-11 MED ORDER — MIDAZOLAM HCL 2 MG/2ML IJ SOLN
INTRAMUSCULAR | Status: DC | PRN
  Administered 2015-10-11: 1 mg via INTRAVENOUS

## 2015-10-11 MED ORDER — PROPOFOL 10 MG/ML IV BOLUS
INTRAVENOUS | Status: DC | PRN
  Administered 2015-10-11: 30 mg via INTRAVENOUS

## 2015-10-11 MED ORDER — SODIUM CHLORIDE 0.9 % IV SOLN
INTRAVENOUS | Status: DC
  Administered 2015-10-11: 1000 mL via INTRAVENOUS

## 2015-10-11 MED ORDER — PHENYLEPHRINE HCL 10 MG/ML IJ SOLN
INTRAMUSCULAR | Status: DC | PRN
  Administered 2015-10-11: 100 ug via INTRAVENOUS

## 2015-10-11 MED ORDER — SODIUM CHLORIDE 0.9 % IV SOLN: INTRAVENOUS | Status: DC

## 2015-10-11 MED ORDER — LIDOCAINE HCL (CARDIAC) 20 MG/ML IV SOLN
INTRAVENOUS | Status: DC | PRN
  Administered 2015-10-11: 40 mg via INTRATRACHEAL

## 2015-10-11 MED ORDER — PROPOFOL 500 MG/50ML IV EMUL
INTRAVENOUS | Status: DC | PRN
  Administered 2015-10-11: 140 ug/kg/min via INTRAVENOUS

## 2015-10-11 NOTE — Anesthesia Procedure Notes (Signed)
Date/Time: 10/11/2015 8:35 AM Performed by: Johnna Acosta Pre-anesthesia Checklist: Patient identified, Emergency Drugs available, Suction available, Patient being monitored and Timeout performed Patient Re-evaluated:Patient Re-evaluated prior to inductionOxygen Delivery Method: Nasal cannula

## 2015-10-11 NOTE — Transfer of Care (Signed)
Immediate Anesthesia Transfer of Care Note  Patient: Gina Carney  Procedure(s) Performed: Procedure(s): COLONOSCOPY WITH PROPOFOL (N/A)  Patient Location: PACU  Anesthesia Type:General  Level of Consciousness: sedated  Airway & Oxygen Therapy: Patient Spontanous Breathing and Patient connected to nasal cannula oxygen  Post-op Assessment: Report given to RN and Post -op Vital signs reviewed and stable  Post vital signs: Reviewed and stable  Last Vitals:  Filed Vitals:   10/11/15 0742 10/11/15 0920  BP: 152/79 97/64  Pulse: 80 79  Temp: 35.8 C 36 C  Resp: 18 16    Last Pain: There were no vitals filed for this visit.       Complications: No apparent anesthesia complications

## 2015-10-11 NOTE — H&P (Signed)
Outpatient short stay form Pre-procedure 10/11/2015 8:31 AM Gina Sails MD  Primary Physician: Dr. Genene Churn   Reason for visit:  Colonoscopy  History of present illness:    Patient is a 82 year old female presenting today for colonoscopy. Her last colonoscopy was in August 2011 with finding of adenomatous colon polyps. She tolerated her prep well. She takes a 81 mg aspirin but has held that for several days. She denies any other aspirin products or blood thinning agents.     Current facility-administered medications:  .  0.9 %  sodium chloride infusion, , Intravenous, Continuous, Gina Sails, MD, Last Rate: 20 mL/hr at 10/11/15 0808, 1,000 mL at 10/11/15 0808 .  0.9 %  sodium chloride infusion, , Intravenous, Continuous, Gina Sails, MD .  0.9 %  sodium chloride infusion, , Intravenous, Continuous, Gina Sails, MD  Prescriptions prior to admission  Medication Sig Dispense Refill Last Dose  . amLODipine (NORVASC) 5 MG tablet Take 5 mg by mouth daily. am   02/20/2015 at 0500  . aspirin 81 MG tablet Take 81 mg by mouth daily. am   Past Week at Unknown time  . Bilberry, Vaccinium myrtillus, (BILBERRY PO) Take 200 mg by mouth daily. Am   Past Week at Unknown time  . Calcium-Phosphorus-Vitamin D (CITRACAL +D3 PO) Take by mouth 2 (two) times daily. Am and pm   Past Week at Unknown time  . canagliflozin (INVOKANA) 300 MG TABS tablet Take 300 mg by mouth daily before breakfast.   02/19/2015 at 1800  . Cholecalciferol (VITAMIN D3) 2000 UNITS TABS Take by mouth daily. breakfast   Past Week at Unknown time  . Chromium-Cinnamon (CINNAMON PLUS CHROMIUM PO) Take 1,000 mg by mouth 2 (two) times daily. Breakfast and dinner   Past Week at Unknown time  . Cyanocobalamin (VITAMIN B-12 PO) Take 2,500 mcg by mouth daily. am   Past Week at Unknown time  . Flaxseed, Linseed, (FLAX SEEDS PO) Take by mouth 2 (two) times daily. Flax,fish,borage am and pm   Past Week at Unknown time  . Garlic  [id number] MG CAPS Take by mouth daily. am   Past Week at Unknown time  . loratadine (CLARITIN) 10 MG tablet Take 10 mg by mouth daily.   02/19/2015 at 0800  . Magnesium 250 MG TABS Take by mouth daily. Breakfast   Past Week at Unknown time  . metFORMIN (GLUCOPHAGE) 500 MG tablet Take 1,000 mg by mouth 2 (two) times daily with a meal. Breakfast and dinner   02/19/2015 at 1800  . Multiple Vitamins-Minerals (CENTRUM ADULTS PO) Take by mouth daily. breakfast   Past Week at Unknown time  . omeprazole (PRILOSEC) 20 MG capsule Take 20 mg by mouth daily. am   02/20/2015 at 0500  . pravastatin (PRAVACHOL) 10 MG tablet Take 10 mg by mouth daily. bedtime   02/19/2015 at 1800  . vitamin E 400 UNIT capsule Take 400 Units by mouth daily. am   Past Week at Unknown time     Allergies  Allergen Reactions  . Bee Venom Anaphylaxis  . Avandia [Rosiglitazone]   . Caffeine Other (See Comments)    disconnected feeling  . Codeine Other (See Comments)    Disconnected feeling  . Fosamax [Alendronate Sodium]   . Premarin [Conjugated Estrogens]   . Singulair [Montelukast Sodium]   . Wheat Bran Hives     Past Medical History  Diagnosis Date  . Shortness of breath dyspnea   . Diverticulitis   .  Hypercholesterolemia   . Sinus trouble   . Hypertension     controlled on meds  . Anemia     takes B12  . Arthritis     knees  . Diabetes mellitus without complication (HCC)     Type 2  . GERD (gastroesophageal reflux disease)   . Chickenpox   . History of Helicobacter pylori infection   . Osteopenia     Review of systems:      Physical Exam    Heart and lungs: Regular rate and rhythm without rub or gallop, lungs are bilaterally clear.    HEENT: Septic atraumatic eyes are anicteric    Other:     Pertinant exam for procedure: Soft nontender nondistended bowel sounds positive normoactive.    Planned proceedures: Colonoscopy and indicated procedures. I have discussed the risks benefits and complications of  procedures to include not limited to bleeding, infection, perforation and the risk of sedation and the patient wishes to proceed.    Gina Sails, MD Gastroenterology 10/11/2015  8:31 AM

## 2015-10-11 NOTE — Anesthesia Preprocedure Evaluation (Signed)
Anesthesia Evaluation  Patient identified by MRN, date of birth, ID band Patient awake    Reviewed: Allergy & Precautions, H&P , NPO status , Patient's Chart, lab work & pertinent test results  History of Anesthesia Complications Negative for: history of anesthetic complications  Airway Mallampati: III  TM Distance: >3 FB Neck ROM: limited    Dental  (+) Poor Dentition, Chipped, Missing, Partial Lower, Partial Upper   Pulmonary shortness of breath and with exertion, former smoker,    Pulmonary exam normal breath sounds clear to auscultation       Cardiovascular Exercise Tolerance: Good hypertension, + DOE  (-) Past MI Normal cardiovascular exam Rhythm:regular Rate:Normal     Neuro/Psych negative neurological ROS  negative psych ROS   GI/Hepatic Neg liver ROS, GERD  Controlled,  Endo/Other  diabetes, Type 2  Renal/GU negative Renal ROS  negative genitourinary   Musculoskeletal  (+) Arthritis ,   Abdominal   Peds  Hematology negative hematology ROS (+)   Anesthesia Other Findings Past Medical History:   Shortness of breath dyspnea                                  Diverticulitis                                               Hypercholesterolemia                                         Sinus trouble                                                Hypertension                                                   Comment:controlled on meds   Anemia                                                         Comment:takes B12   Arthritis                                                      Comment:knees   Diabetes mellitus without complication (HCC)                   Comment:Type 2   GERD (gastroesophageal reflux disease)                       Chickenpox  History of Helicobacter pylori infection                     Osteopenia                                                   Past Surgical History:   CHOLECYSTECTOMY                                               ABDOMINAL HYSTERECTOMY                                        BREAST SURGERY                                  Left                Comment:biopsy   CATARACT EXTRACTION W/PHACO                     Right 01/16/2015      Comment:Procedure: CATARACT EXTRACTION PHACO AND               INTRAOCULAR LENS PLACEMENT (IOC);  Surgeon:               Leandrew Koyanagi, MD;  Location: [facility];  Service: Ophthalmology;                Laterality: Right;  DIABETIC-oral meds   CATARACT EXTRACTION W/PHACO                     Left 02/20/2015      Comment:Procedure: CATARACT EXTRACTION PHACO AND               INTRAOCULAR LENS PLACEMENT (IOC);  Surgeon:               Leandrew Koyanagi, MD;  Location: [facility];  Service: Ophthalmology;                Laterality: Left;  DIABETIC - oral meds   BREAST BIOPSY                                   Left                Comment:neg   COLONOSCOPY                                                   EYE SURGERY  BMI    Body Mass Index   30.71 kg/m 2      Reproductive/Obstetrics negative OB ROS                             Anesthesia Physical Anesthesia Plan  ASA: III  Anesthesia Plan: General   Post-op Pain Management:    Induction:   Airway Management Planned:   Additional Equipment:   Intra-op Plan:   Post-operative Plan:   Informed Consent: I have reviewed the patients History and Physical, chart, labs and discussed the procedure including the risks, benefits and alternatives for the proposed anesthesia with the patient or authorized representative who has indicated his/her understanding and acceptance.   Dental Advisory Given  Plan Discussed with: Anesthesiologist, CRNA and Surgeon  Anesthesia Plan Comments:         Anesthesia  Quick Evaluation

## 2015-10-11 NOTE — Anesthesia Postprocedure Evaluation (Signed)
Anesthesia Post Note  Patient: Gina Carney  Procedure(s) Performed: Procedure(s) (LRB): COLONOSCOPY WITH PROPOFOL (N/A)  Patient location during evaluation: Endoscopy Anesthesia Type: General Level of consciousness: awake and alert Pain management: pain level controlled Vital Signs Assessment: post-procedure vital signs reviewed and stable Respiratory status: spontaneous breathing, nonlabored ventilation, respiratory function stable and patient connected to nasal cannula oxygen Cardiovascular status: blood pressure returned to baseline and stable Postop Assessment: no signs of nausea or vomiting Anesthetic complications: no    Last Vitals:  Filed Vitals:   10/11/15 0930 10/11/15 0940  BP: 119/82 127/85  Pulse: 73 71  Temp:    Resp: 16 16    Last Pain: There were no vitals filed for this visit.               Precious Haws Reyn Faivre

## 2015-10-11 NOTE — Op Note (Signed)
[facility] Gastroenterology Patient Name: Gina Carney Procedure Date: 10/11/2015 8:34 AM MRN: [id number] Account #: [id number] Date of Birth: 12/09/1933 Admit Type: Outpatient Age: 82 Room: [facility] ENDO ROOM 4 Gender: Female Note Status: Finalized Procedure:            Colonoscopy Indications:          Personal history of colonic polyps Providers:            Lollie Sails, MD Referring MD:         Christena Flake. Raechel Ache, MD (Referring MD) Medicines:            Monitored Anesthesia Care Complications:        No immediate complications. Procedure:            Pre-Anesthesia Assessment:                       - ASA Grade Assessment: III - A patient with severe                        systemic disease.                       After obtaining informed consent, the colonoscope was                        passed under direct vision. Throughout the procedure,                        the patient's blood pressure, pulse, and oxygen                        saturations were monitored continuously. The                        Colonoscope was introduced through the anus and                        advanced to the the cecum, identified by appendiceal                        orifice and ileocecal valve. The colonoscopy was                        performed with moderate difficulty. Successful                        completion of the procedure was aided by changing the                        patient to a supine position, changing the patient to a                        prone position, using manual pressure and withdrawing                        and reinserting the scope. The quality of the bowel                        preparation was good. Findings:      A 11 mm polyp was found in the descending colon.  The polyp was       pedunculated. The polyp was removed with a hot snare. Resection and       retrieval were complete. To prevent bleeding after the polypectomy, one       hemostatic clip was  successfully placed. There was no bleeding at the       end of the maneuver.      A 6 mm polyp was found in the splenic flexure. The polyp was sessile.       The polyp was removed with a cold snare. Resection and retrieval were       complete.      A 2 mm polyp was found in the hepatic flexure. The polyp was sessile.       The polyp was removed with a cold biopsy forceps. Resection and       retrieval were complete.      Non-bleeding internal hemorrhoids were found during retroflexion and       during anoscopy. The hemorrhoids were small and Grade II (internal       hemorrhoids that prolapse but reduce spontaneously).      The digital rectal exam findings include multiple skin tags.      Multiple small and large-mouthed diverticula were found in the sigmoid       colon, descending colon, transverse colon and ascending colon. Impression:           - One 11 mm polyp in the descending colon, removed with                        a hot snare. Resected and retrieved. Clip was placed.                       - One 6 mm polyp at the splenic flexure, removed with a                        cold snare. Resected and retrieved.                       - One 2 mm polyp at the hepatic flexure, removed with a                        cold biopsy forceps. Resected and retrieved.                       - Non-bleeding internal hemorrhoids.                       - Multiple skin tags found on digital rectal exam.                       - Diverticulosis in the sigmoid colon, in the                        descending colon, in the transverse colon and in the                        ascending colon. Recommendation:       - Discharge patient to home. Procedure Code(s):    --- Professional ---  45385, Colonoscopy, flexible; with removal of tumor(s),                        polyp(s), or other lesion(s) by snare technique                       45380, 59, Colonoscopy, flexible; with biopsy, single                         or multiple Diagnosis Code(s):    --- Professional ---                       D12.4, Benign neoplasm of descending colon                       D12.3, Benign neoplasm of transverse colon (hepatic                        flexure or splenic flexure)                       K64.1, Second degree hemorrhoids                       Z86.010, Personal history of colonic polyps                       K57.30, Diverticulosis of large intestine without                        perforation or abscess without bleeding CPT copyright 2016 American Medical Association. All rights reserved. The codes documented in this report are preliminary and upon coder review may  be revised to meet current compliance requirements. Lollie Sails, MD 10/11/2015 9:20:40 AM This report has been signed electronically. Number of Addenda: 0 Note Initiated On: 10/11/2015 8:34 AM Scope Withdrawal Time: 0 hours 9 minutes 12 seconds  Total Procedure Duration: 0 hours 35 minutes 2 seconds       [facility]

## 2015-10-13 ENCOUNTER — Encounter: Payer: Self-pay | Admitting: Gastroenterology

## 2015-10-14 LAB — SURGICAL PATHOLOGY

## 2016-01-31 ENCOUNTER — Encounter: Payer: Self-pay | Admitting: *Deleted

## 2016-01-31 ENCOUNTER — Encounter: Payer: Medicare Other | Attending: Internal Medicine | Admitting: *Deleted

## 2016-01-31 VITALS — BP 130/78 | Ht 64.0 in | Wt 173.6 lb

## 2016-01-31 DIAGNOSIS — Z794 Long term (current) use of insulin: Secondary | ICD-10-CM

## 2016-01-31 DIAGNOSIS — E119 Type 2 diabetes mellitus without complications: Secondary | ICD-10-CM | POA: Insufficient documentation

## 2016-01-31 DIAGNOSIS — Z713 Dietary counseling and surveillance: Secondary | ICD-10-CM | POA: Insufficient documentation

## 2016-01-31 DIAGNOSIS — Z6829 Body mass index (BMI) 29.0-29.9, adult: Secondary | ICD-10-CM | POA: Insufficient documentation

## 2016-01-31 NOTE — Progress Notes (Signed)
Diabetes Self-Management Education  Visit Type: First/Initial  Appt. Start Time: 1050 Appt. End Time: 1215  01/31/2016  Ms. Gina Carney, identified by name and date of birth, is a 82 y.o. female with a diagnosis of Diabetes: Type 2.   ASSESSMENT  Blood pressure 130/78, height _0  (1.626 m), weight 173 lb 9.6 oz (78.7 kg). Body mass index is 29.8 kg/m.      Diabetes Self-Management Education - 01/31/16 1245      Visit Information   Visit Type First/Initial     Initial Visit   Diabetes Type Type 2   Are you currently following a meal plan? No   Are you taking your medications as prescribed? Yes   Date Diagnosed 1990     Health Coping   How would you rate your overall health? Good     Psychosocial Assessment   Patient Belief/Attitude about Diabetes Motivated to manage diabetes   Self-care barriers None   Self-management support Doctor's office;Family   Other persons present Spouse/SO   Patient Concerns Medication;Monitoring;Healthy Lifestyle;Problem Solving;Glycemic Control   Special Needs None   Preferred Learning Style Auditory;Visual;Hands on   Learning Readiness Ready   How often do you need to have someone help you when you read instructions, pamphlets, or other written materials from your doctor or pharmacy? 1 - Never   What is the last grade level you completed in school? 2 years college     Pre-Education Assessment   Patient understands the diabetes disease and treatment process. Needs Review   Patient understands incorporating nutritional management into lifestyle. Needs Review   Patient undertands incorporating physical activity into lifestyle. Needs Review   Patient understands using medications safely. Needs Instruction   Patient understands monitoring blood glucose, interpreting and using results Needs Review   Patient understands prevention, detection, and treatment of acute complications. Needs Instruction   Patient understands prevention,  detection, and treatment of chronic complications. Needs Review   Patient understands how to develop strategies to address psychosocial issues. Needs Review   Patient understands how to develop strategies to promote health/change behavior. Needs Review     Complications   Last HgB A1C per patient/outside source 8.1 %  12/24/15   How often do you check your blood sugar? 1-2 times/day   Fasting Blood glucose range (mg/dL) 130-179;>200  Pt reports FBG's usually 160's mg/dL but had reading of 205 mg/dL today.    Have you had a dilated eye exam in the past 12 months? Yes   Have you had a dental exam in the past 12 months? Yes   Are you checking your feet? Yes   How many days per week are you checking your feet? 7     Dietary Intake   Breakfast oatmeal, boiled egg, toast and occasional sausage   Lunch chicken or tuna salad or ham sandwich, occasional fruit   Dinner steak, [place], chicekn with potatoes, bread, stuffing, beans, green beans, cabbage   Snack (evening) pork skins   Beverage(s) water, regular green tea, coffee     Exercise   Exercise Type Light (walking / raking leaves)   How many days per week to you exercise? 2   How many minutes per day do you exercise? 20   Total minutes per week of exercise 40     Patient Education   Previous Diabetes Education Yes (please comment)  few years ago in our [place] clinic   Disease state  Explored patient's options for treatment of their diabetes  Pt reports she wanted to begin Insulin due to mutltiple stresses in her life and wanted better BG control.    Nutrition management  Food label reading, portion sizes and measuring food.;Carbohydrate counting   Physical activity and exercise  Role of exercise on diabetes management, blood pressure control and cardiac health.   Medications Taught/reviewed insulin injection, site rotation, insulin storage and needle disposal.;Reviewed patients medication for diabetes, action, purpose, timing of dose and  side effects.  She was taking Bydureon but had to stop due to cost. She was aware how to use insulin pen.    Monitoring Purpose and frequency of SMBG.;Identified appropriate SMBG and/or A1C goals.   Acute complications Taught treatment of hypoglycemia - the 15 rule.   Chronic complications Relationship between chronic complications and blood glucose control   Psychosocial adjustment Role of stress on diabetes;Identified and addressed patients feelings and concerns about diabetes     Individualized Goals (developed by patient)   Reducing Risk Other (comment)     Outcomes   Expected Outcomes Demonstrated interest in learning. Expect positive outcomes   Future DMSE PRN   Program Status Not Completed      Individualized Plan for Diabetes Self-Management Training:   Learning Objective:  Patient will have a greater understanding of diabetes self-management. Patient education plan is to attend individual and/or group sessions per assessed needs and concerns.   Plan:   Patient Instructions  Check blood sugars 1 x day before breakfast every day and occasionally 2 hrs after supper  Exercise: Continue walking for   20 minutes  2 days a week and increase as tolerated Eat 3 meals day,  1-2  snacks a day Space meals 4-6 hours apart Avoid sugar sweetened drinks (green tea)  Carry fast acting glucose and a snack at all times Roll your insulin pen before each injection Rotate injection sites Call if you want to schedule any further appointments   Expected Outcomes:  Demonstrated interest in learning. Expect positive outcomes  Education material provided:  General Meal Planning Guidelines Simple Meal Plan Insulin pen start kit (BD) Symptoms, causes and treatments of Hypoglycemia  If problems or questions, patient to contact team via:  Johny Drilling, [place], [place], CDE [phone]  Future DSME appointment: PRN  Pt reports she doesn't want to return for further diabetes education at this  time.

## 2016-01-31 NOTE — Patient Instructions (Signed)
Check blood sugars 1 x day before breakfast every day and occasionally 2 hrs after supper   Exercise: Continue walking for   20 minutes  2 days a week and increase as tolerated  Eat 3 meals day,  1-2  snacks a day Space meals 4-6 hours apart Avoid sugar sweetened drinks (green tea)   Carry fast acting glucose and a snack at all times Roll your insulin pen before each injection Rotate injection sites  Call if you want to schedule any further appointments

## 2016-03-06 ENCOUNTER — Ambulatory Visit
Admission: EM | Admit: 2016-03-06 | Discharge: 2016-03-06 | Disposition: A | Discharge status: Home / Self Care | Payer: Medicare Other | Attending: Family Medicine | Admitting: Family Medicine

## 2016-03-06 DIAGNOSIS — S61219A Laceration without foreign body of unspecified finger without damage to nail, initial encounter: Secondary | ICD-10-CM

## 2016-03-06 MED ORDER — TETANUS-DIPHTH-ACELL PERTUSSIS 5-2.5-18.5 LF-MCG/0.5 IM SUSP
0.5000 mL | Freq: Once | INTRAMUSCULAR | Status: AC
  Administered 2016-03-06: 0.5 mL via INTRAMUSCULAR

## 2016-03-06 MED ORDER — MUPIROCIN 2 % EX OINT: 1.0000 "application " | TOPICAL_OINTMENT | Freq: Three times a day (TID) | CUTANEOUS | 0 refills | Status: AC

## 2016-03-06 NOTE — ED Provider Notes (Signed)
CSN: [id number]     Arrival date & time 03/06/16  1712 History   First MD Initiated Contact with Patient 03/06/16 1744     Chief Complaint  Patient presents with  . Finger Injury    Right Index Finger Laceration   (Consider location/radiation/quality/duration/timing/severity/associated sxs/prior Treatment) HPI  This 82 year old female who is washing a glass today broke and she lacerated her the PIP joint. This is radial based. Not current on her tetanus.      Past Medical History:  Diagnosis Date  . Anemia    takes B12  . Arthritis    knees  . Chickenpox   . Diabetes mellitus without complication (HCC)    Type 2  . Diverticulitis   . GERD (gastroesophageal reflux disease)   . History of Helicobacter pylori infection   . Hypercholesterolemia   . Hypertension    controlled on meds  . Osteopenia   . Shortness of breath dyspnea   . Sinus trouble    Past Surgical History:  Procedure Laterality Date  . ABDOMINAL HYSTERECTOMY    . BREAST BIOPSY Left    neg  . BREAST SURGERY Left    biopsy  . CATARACT EXTRACTION W/PHACO Right 01/16/2015   Procedure: CATARACT EXTRACTION PHACO AND INTRAOCULAR LENS PLACEMENT (IOC);  Surgeon: Leandrew Koyanagi, MD;  Location: [place];  Service: Ophthalmology;  Laterality: Right;  DIABETIC-oral meds  . CATARACT EXTRACTION W/PHACO Left 02/20/2015   Procedure: CATARACT EXTRACTION PHACO AND INTRAOCULAR LENS PLACEMENT (IOC);  Surgeon: Leandrew Koyanagi, MD;  Location: [place];  Service: Ophthalmology;  Laterality: Left;  DIABETIC - oral meds  . CHOLECYSTECTOMY    . COLONOSCOPY    . COLONOSCOPY WITH PROPOFOL N/A 10/11/2015   Procedure: COLONOSCOPY WITH PROPOFOL;  Surgeon: Lollie Sails, MD;  [facility] END[facility]COPY;  Service: Endoscopy;  Laterality: N/A;  . EYE SURGERY     Family History  Problem Relation Age of Onset  . Breast cancer Sister 93    2 sisters  . Breast cancer Sister 63  . Diabetes Sister   . CAD  Mother   . Hypertension Mother   . CAD Father    Social History  Substance Use Topics  . Smoking status: Former Smoker    Packs/day: 0.25    Years: 30.00    Types: Cigarettes    Quit date: 06/08/1984  . Smokeless tobacco: Never Used  . Alcohol use No   OB History    No data available     Review of Systems  Constitutional: Negative for activity change, chills, fatigue and fever.  Skin: Positive for wound.  All other systems reviewed and are negative.   Allergies  Bee venom; Caffeine; Codeine; Fosamax [alendronate sodium]; Premarin [conjugated estrogens]; Singulair [montelukast sodium]; Wheat bran; and Avandia [rosiglitazone]  Home Medications   Prior to Admission medications   Medication Sig Start Date End Date Taking? Authorizing Provider  amLODipine (NORVASC) 5 MG tablet Take 5 mg by mouth daily. am    Historical Provider, MD  aspirin 81 MG tablet Take 81 mg by mouth daily. am    Historical Provider, MD  Bilberry, Vaccinium myrtillus, (BILBERRY PO) Take 200 mg by mouth daily. Am    Historical Provider, MD  Calcium-Phosphorus-Vitamin D (CITRACAL +D3 PO) Take by mouth 2 (two) times daily. Am and pm    Historical Provider, MD  canagliflozin (INVOKANA) 300 MG TABS tablet Take 300 mg by mouth daily before breakfast.    Historical Provider, MD  Cholecalciferol (  VITAMIN D3) 2000 UNITS TABS Take by mouth daily. breakfast    Historical Provider, MD  Chromium-Cinnamon (CINNAMON PLUS CHROMIUM PO) Take 1,000 mg by mouth 2 (two) times daily. Breakfast and dinner    Historical Provider, MD  Cyanocobalamin (VITAMIN B-12 PO) Take 2,500 mcg by mouth daily. am    Historical Provider, MD  Flaxseed, Linseed, (FLAX SEEDS PO) Take by mouth 2 (two) times daily. Flax,fish,borage am and pm    Historical Provider, MD  Garlic [id number] MG CAPS Take by mouth daily. am    Historical Provider, MD  Insulin NPH, Human,, Isophane, (HUMULIN N) 100 UNIT/ML Kiwkpen Inject 8 Units into the skin at bedtime.     Historical Provider, MD  loratadine (CLARITIN) 10 MG tablet Take 10 mg by mouth daily as needed.     Historical Provider, MD  magnesium oxide (MAG-OX) 400 MG tablet Take 400 mg by mouth daily.    Historical Provider, MD  metFORMIN (GLUCOPHAGE) 500 MG tablet Take 1,000 mg by mouth 2 (two) times daily with a meal. Breakfast and dinner    Historical Provider, MD  Multiple Vitamins-Minerals (CENTRUM ADULTS PO) Take by mouth daily. breakfast    Historical Provider, MD  mupirocin ointment (BACTROBAN) 2 % Apply 1 application topically 3 (three) times daily. 03/06/16   Lorin Picket, PA-C  omeprazole (PRILOSEC) 20 MG capsule Take 20 mg by mouth daily. am    Historical Provider, MD  pravastatin (PRAVACHOL) 10 MG tablet Take 10 mg by mouth daily. bedtime    Historical Provider, MD  vitamin E 400 UNIT capsule Take 400 Units by mouth daily. am    Historical Provider, MD   Meds Ordered and Administered this Visit   Medications  Tdap (BOOSTRIX) injection 0.5 mL (0.5 mLs Intramuscular Given 03/06/16 1833)    BP (!) 141/82 (BP Location: Left Arm)   Pulse 83   Temp 98 F (36.7 C) (Oral)   Resp 16   Ht 5\' 4"  (1.626 m)   Wt 173 lb (78.5 kg)   SpO2 100%   BMI 29.70 kg/m  No data found.   Physical Exam  Constitutional: She is oriented to person, place, and time. She appears well-developed and well-nourished. No distress.  HENT:  Head: Normocephalic and atraumatic.  Eyes: EOM are normal. Pupils are equal, round, and reactive to light.  Neck: Normal range of motion. Neck supple.  Musculoskeletal: Normal range of motion. She exhibits tenderness. She exhibits no edema or deformity.  Neurological: She is alert and oriented to person, place, and time.  Skin: Skin is warm and dry. She is not diaphoretic.  Examination of her dominant right index fingers shows a curvilinear laceration overlying the PIP joint on the radial aspect. The laceration is just distal to the joint. Total length is approximately  1-1/2 cm. Neurovascular function is intact distally. Range of motion of the joints are all normal and full.  Psychiatric: She has a normal mood and affect. Her behavior is normal. Judgment and thought content normal.  Nursing note and vitals reviewed.   Urgent Care Course   Clinical Course    .Marland KitchenLaceration Repair Date/Time: 03/06/2016 7:05 PM Performed by: Lorin Picket Authorized by: Norval Gable   Consent:    Consent obtained:  Verbal   Consent given by:  Patient   Risks discussed:  Infection and pain   Alternatives discussed:  Referral Anesthesia (see MAR for exact dosages):    Anesthesia method:  Local infiltration   Local anesthetic:  Lidocaine  1% w/o epi Laceration details:    Location:  Finger   Finger location:  R index finger   Length (cm):  1.5   Depth (mm):  3 Repair type:    Repair type:  Simple Pre-procedure details:    Preparation:  Patient was prepped and draped in usual sterile fashion Exploration:    Hemostasis achieved with:  Direct pressure   Wound exploration: entire depth of wound probed and visualized     Contaminated: no   Treatment:    Area cleansed with:  Betadine   Amount of cleaning:  Extensive   Irrigation solution:  Sterile saline   Irrigation volume:  30   Irrigation method:  Pressure wash   Visualized foreign bodies/material removed: no   Skin repair:    Repair method:  Sutures   Suture size:  4-0   Suture material:  Nylon   Suture technique:  Running   Number of sutures:  5 Approximation:    Approximation:  Close   Vermilion border: well-aligned   Post-procedure details:    Dressing:  Sterile dressing and antibiotic ointment   Patient tolerance of procedure:  Tolerated well, no immediate complications   (including critical care time)  Labs Review Labs Reviewed - No data to display  Imaging Review No results found.   Visual Acuity Review  Right Eye Distance:   Left Eye Distance:   Bilateral Distance:    Right  Eye Near:   Left Eye Near:    Bilateral Near:     Medications  Tdap (BOOSTRIX) injection 0.5 mL (0.5 mLs Intramuscular Given 03/06/16 1833)      MDM   1. Finger laceration, initial encounter    Discharge Medication List as of 03/06/2016  6:35 PM    START taking these medications   Details  mupirocin ointment (BACTROBAN) 2 % Apply 1 application topically 3 (three) times daily., Starting Fri 03/06/2016, Normal      Plan: 1. Test/x-ray results and diagnosis reviewed with patient 2. rx as per orders; risks, benefits, potential side effects reviewed with patient 3. Recommend supportive treatment with keep dry for 24 hours to begin washing program 3 times daily drying thoroughly and applying Bactroban. We'll plan on removing stitches at 10 days. If she has any change in any signs or symptoms of infection she should return to our clinic immediately. 4. F/u prn if symptoms worsen or don't improve     Lorin Picket, PA-C 03/06/16 1919

## 2016-03-06 NOTE — ED Triage Notes (Signed)
Patient presents with a laceration to her right index finger while washing a glass just a few minutes ago

## 2016-03-16 ENCOUNTER — Encounter: Payer: Self-pay | Admitting: *Deleted

## 2016-03-16 ENCOUNTER — Ambulatory Visit
Admission: EM | Admit: 2016-03-16 | Discharge: 2016-03-16 | Disposition: A | Discharge status: Home / Self Care | Payer: Medicare Other

## 2016-03-16 NOTE — ED Triage Notes (Addendum)
Suture remove. 5 suture removed. No visible redness or drainage. Laceration is well healed.

## 2016-08-31 ENCOUNTER — Other Ambulatory Visit: Payer: Self-pay | Admitting: Internal Medicine

## 2016-08-31 DIAGNOSIS — Z1231 Encounter for screening mammogram for malignant neoplasm of breast: Secondary | ICD-10-CM

## 2016-09-02 ENCOUNTER — Ambulatory Visit
Admission: RE | Admit: 2016-09-02 | Discharge: 2016-09-02 | Disposition: A | Discharge status: Home / Self Care | Payer: Medicare Other | Source: Ambulatory Visit | Attending: Internal Medicine | Admitting: Internal Medicine

## 2016-09-02 DIAGNOSIS — Z1231 Encounter for screening mammogram for malignant neoplasm of breast: Secondary | ICD-10-CM | POA: Diagnosis not present

## 2017-06-15 ENCOUNTER — Other Ambulatory Visit: Payer: Self-pay | Admitting: Internal Medicine

## 2017-06-15 DIAGNOSIS — Z1239 Encounter for other screening for malignant neoplasm of breast: Secondary | ICD-10-CM

## 2017-06-15 DIAGNOSIS — Z803 Family history of malignant neoplasm of breast: Secondary | ICD-10-CM

## 2017-09-22 ENCOUNTER — Ambulatory Visit
Admission: RE | Admit: 2017-09-22 | Discharge: 2017-09-22 | Disposition: A | Discharge status: Home / Self Care | Payer: Medicare Other | Source: Ambulatory Visit | Attending: Internal Medicine | Admitting: Internal Medicine

## 2017-09-22 ENCOUNTER — Encounter (INDEPENDENT_AMBULATORY_CARE_PROVIDER_SITE_OTHER): Payer: Self-pay

## 2017-09-22 DIAGNOSIS — Z803 Family history of malignant neoplasm of breast: Secondary | ICD-10-CM

## 2017-09-22 DIAGNOSIS — Z1239 Encounter for other screening for malignant neoplasm of breast: Secondary | ICD-10-CM

## 2017-09-22 DIAGNOSIS — Z1231 Encounter for screening mammogram for malignant neoplasm of breast: Secondary | ICD-10-CM | POA: Diagnosis not present

## 2018-05-30 ENCOUNTER — Other Ambulatory Visit: Payer: Self-pay

## 2018-05-30 ENCOUNTER — Encounter: Payer: Self-pay | Admitting: Emergency Medicine

## 2018-05-30 ENCOUNTER — Ambulatory Visit
Admission: EM | Admit: 2018-05-30 | Discharge: 2018-05-30 | Disposition: A | Discharge status: Home / Self Care | Payer: Medicare Other | Attending: Family Medicine | Admitting: Family Medicine

## 2018-05-30 DIAGNOSIS — R05 Cough: Secondary | ICD-10-CM

## 2018-05-30 DIAGNOSIS — B9789 Other viral agents as the cause of diseases classified elsewhere: Secondary | ICD-10-CM | POA: Insufficient documentation

## 2018-05-30 DIAGNOSIS — R0981 Nasal congestion: Secondary | ICD-10-CM

## 2018-05-30 DIAGNOSIS — Z87891 Personal history of nicotine dependence: Secondary | ICD-10-CM | POA: Diagnosis not present

## 2018-05-30 DIAGNOSIS — J069 Acute upper respiratory infection, unspecified: Secondary | ICD-10-CM | POA: Insufficient documentation

## 2018-05-30 DIAGNOSIS — J0100 Acute maxillary sinusitis, unspecified: Secondary | ICD-10-CM | POA: Insufficient documentation

## 2018-05-30 MED ORDER — DOXYCYCLINE HYCLATE 100 MG PO CAPS: 100.0000 mg | ORAL_CAPSULE | Freq: Two times a day (BID) | ORAL | 0 refills | Status: DC

## 2018-05-30 MED ORDER — BENZONATATE 100 MG PO CAPS: 100.0000 mg | ORAL_CAPSULE | Freq: Three times a day (TID) | ORAL | 0 refills | Status: DC | PRN

## 2018-05-30 NOTE — ED Provider Notes (Signed)
[facility] ____________________________________________  Time seen: Approximately 11:07 AM  I have reviewed the triage vital signs and the nursing notes.   HISTORY  Chief Complaint Cough   HPI Gina Carney is a 84 y.o. female presenting for evaluation of 1 to 1.5 weeks of runny nose, nasal congestion, postnasal drainage and cough.  States cough was initially intermittently productive, now only a tickle and a hacking cough.  Continues with nasal congestion nasal drainage.  Intermittent sinus pressure.  States feels consistent with previous sinusitis.  Intermittent sore throat.  Continues to eat and drink well.  Did try over-the-counter Delsym and honey tea with some improvement but no resolution.  Denies any fevers.  Reports her nephew recently sick with similar.  Denies accompanying chest pain, shortness of breath or abdominal pain.  Denies other relieving factors.  Reports otherwise doing well.  Denies renal insufficiency.  Ezequiel Kayser, MD: PCP   Past Medical History:  Diagnosis Date  . Anemia    takes B12  . Arthritis    knees  . Chickenpox   . Diabetes mellitus without complication (HCC)    Type 2  . Diverticulitis   . GERD (gastroesophageal reflux disease)   . History of Helicobacter pylori infection   . Hypercholesterolemia   . Hypertension    controlled on meds  . Osteopenia   . Shortness of breath dyspnea   . Sinus trouble     There are no active problems to display for this patient.   Past Surgical History:  Procedure Laterality Date  . ABDOMINAL HYSTERECTOMY    . BREAST BIOPSY Left    neg  . BREAST SURGERY Left    biopsy  . CATARACT EXTRACTION W/PHACO Right 01/16/2015   Procedure: CATARACT EXTRACTION PHACO AND INTRAOCULAR LENS PLACEMENT (IOC);  Surgeon: Leandrew Koyanagi, MD;  Location: [place];  Service: Ophthalmology;  Laterality: Right;  DIABETIC-oral meds  . CATARACT EXTRACTION W/PHACO Left 02/20/2015   Procedure:  CATARACT EXTRACTION PHACO AND INTRAOCULAR LENS PLACEMENT (IOC);  Surgeon: Leandrew Koyanagi, MD;  Location: [place];  Service: Ophthalmology;  Laterality: Left;  DIABETIC - oral meds  . CHOLECYSTECTOMY    . COLONOSCOPY    . COLONOSCOPY WITH PROPOFOL N/A 10/11/2015   Procedure: COLONOSCOPY WITH PROPOFOL;  Surgeon: Lollie Sails, MD;  Location: [facility] ENDOSCOPY;  Service: Endoscopy;  Laterality: N/A;  . EYE SURGERY       No current facility-administered medications for this encounter.   Current Outpatient Medications:  .  amLODipine (NORVASC) 5 MG tablet, Take 5 mg by mouth daily. am, Disp: , Rfl:  .  aspirin 81 MG tablet, Take 81 mg by mouth daily. am, Disp: , Rfl:  .  Bilberry, Vaccinium myrtillus, (BILBERRY PO), Take 200 mg by mouth daily. Am, Disp: , Rfl:  .  Calcium-Phosphorus-Vitamin D (CITRACAL +D3 PO), Take by mouth 2 (two) times daily. Am and pm, Disp: , Rfl:  .  canagliflozin (INVOKANA) 300 MG TABS tablet, Take 300 mg by mouth daily before breakfast., Disp: , Rfl:  .  Cholecalciferol (VITAMIN D3) 2000 UNITS TABS, Take by mouth daily. breakfast, Disp: , Rfl:  .  Chromium-Cinnamon (CINNAMON PLUS CHROMIUM PO), Take 1,000 mg by mouth 2 (two) times daily. Breakfast and dinner, Disp: , Rfl:  .  Cyanocobalamin (VITAMIN B-12 PO), Take 2,500 mcg by mouth daily. am, Disp: , Rfl:  .  Flaxseed, Linseed, (FLAX SEEDS PO), Take by mouth 2 (two) times daily. Flax,fish,borage am and pm, Disp: ,  Rfl:  .  Garlic 8676 MG CAPS, Take by mouth daily. am, Disp: , Rfl:  .  Insulin NPH, Human,, Isophane, (HUMULIN N) 100 UNIT/ML Kiwkpen, Inject 8 Units into the skin at bedtime., Disp: , Rfl:  .  loratadine (CLARITIN) 10 MG tablet, Take 10 mg by mouth daily as needed. , Disp: , Rfl:  .  magnesium oxide (MAG-OX) 400 MG tablet, Take 400 mg by mouth daily., Disp: , Rfl:  .  metFORMIN (GLUCOPHAGE) 500 MG tablet, Take 1,000 mg by mouth 2 (two) times daily with a meal. Breakfast and dinner, Disp: , Rfl:   .  Multiple Vitamins-Minerals (CENTRUM ADULTS PO), Take by mouth daily. breakfast, Disp: , Rfl:  .  omeprazole (PRILOSEC) 20 MG capsule, Take 20 mg by mouth daily. am, Disp: , Rfl:  .  pravastatin (PRAVACHOL) 10 MG tablet, Take 10 mg by mouth daily. bedtime, Disp: , Rfl:  .  vitamin E 400 UNIT capsule, Take 400 Units by mouth daily. am, Disp: , Rfl:  .  benzonatate (TESSALON PERLES) 100 MG capsule, Take 1 capsule (100 mg total) by mouth 3 (three) times daily as needed for cough., Disp: 15 capsule, Rfl: 0 .  doxycycline (VIBRAMYCIN) 100 MG capsule, Take 1 capsule (100 mg total) by mouth 2 (two) times daily., Disp: 20 capsule, Rfl: 0 .  mupirocin ointment (BACTROBAN) 2 %, Apply 1 application topically 3 (three) times daily., Disp: 22 g, Rfl: 0  Allergies Bee venom; Caffeine; Codeine; Fosamax [alendronate sodium]; Premarin [conjugated estrogens]; Singulair [montelukast sodium]; Wheat bran; and Avandia [rosiglitazone]  Family History  Problem Relation Age of Onset  . Breast cancer Sister 79       2 sisters  . Breast cancer Sister 84  . Diabetes Sister   . Breast cancer Other   . CAD Mother   . Hypertension Mother   . CAD Father     Social History Social History   Tobacco Use  . Smoking status: Former Smoker    Packs/day: 0.25    Years: 30.00    Pack years: 7.50    Types: Cigarettes    Last attempt to quit: 06/08/1984    Years since quitting: 33.9  . Smokeless tobacco: Never Used  Substance Use Topics  . Alcohol use: No  . Drug use: No    Review of Systems Constitutional: No fever ENT: As above.  Cardiovascular: Denies chest pain. Respiratory: Denies shortness of breath. Gastrointestinal: No abdominal pain.   Musculoskeletal: Negative for back pain. Skin: Negative for rash.   ____________________________________________   PHYSICAL EXAM:  VITAL SIGNS: ED Triage Vitals  Enc Vitals Group     BP 05/30/18 1009 (!) 146/85     Pulse Rate 05/30/18 1009 85     Resp  05/30/18 1009 16     Temp 05/30/18 1009 98.4 F (36.9 C)     Temp Source 05/30/18 1009 Oral     SpO2 05/30/18 1009 100 %     Weight 05/30/18 1010 180 lb (81.6 kg)     Height 05/30/18 1010 5\' 4"  (1.626 m)     Head Circumference --      Peak Flow --      Pain Score 05/30/18 1008 8     Pain Loc --      Pain Edu? --      Excl. in [place]? --     Constitutional: Alert and oriented. Well appearing and in no acute distress. Eyes: Conjunctivae are normal. Head: Atraumatic.No tenderness to  palpation bilateral frontal and maxillary sinuses. No swelling. No erythema.   Ears: no erythema, normal TMs bilaterally.   Nose: nasal congestion with bilateral nasal turbinate erythema and edema.   Mouth/Throat: Mucous membranes are moist.  Oropharynx non-erythematous.No tonsillar swelling or exudate.  Neck: No stridor.  No cervical spine tenderness to palpation. Hematological/Lymphatic/Immunilogical: No cervical lymphadenopathy. Cardiovascular: Normal rate, regular rhythm. Grossly normal heart sounds.  Good peripheral circulation. Respiratory: Normal respiratory effort.  No retractions.No wheezes, rales or rhonchi. Good air movement.  Speaks in complete sentences.  Dry intermittent cough in room. Musculoskeletal: Steady gait.  No lower extremity edema noted bilaterally. Neurologic:  Normal speech and language. No gait instability. Skin:  Skin is warm, dry and intact. No rash noted. Psychiatric: Mood and affect are normal. Speech and behavior are normal.  ___________________________________________   LABS (all labs ordered are listed, but only abnormal results are displayed)  Labs Reviewed - No data to display   PROCEDURES Procedures   INITIAL IMPRESSION / ASSESSMENT AND PLAN / ED COURSE  Pertinent labs & imaging results that were available during my care of the patient were reviewed by me and considered in my medical decision making (see chart for details).  Appearing patient.  No acute distress.   Suspect recent viral upper respiratory infection.  Suspect secondary sinusitis.  And as patient continues with cough, will treat with oral doxycycline.  Tessalon Perles.  Encourage rest, fluids, supportive care.Discussed indication, risks and benefits of medications with patient.  Discussed follow up with Primary care physician this week. Discussed follow up and return parameters including no resolution or any worsening concerns. Patient verbalized understanding and agreed to plan.   ____________________________________________   FINAL CLINICAL IMPRESSION(S) / ED DIAGNOSES  Final diagnoses:  Acute maxillary sinusitis, recurrence not specified  Viral URI with cough     ED Discharge Orders         Ordered    doxycycline (VIBRAMYCIN) 100 MG capsule  2 times daily     05/30/18 1034    benzonatate (TESSALON PERLES) 100 MG capsule  3 times daily PRN     05/30/18 1034           Note: This dictation was prepared with Dragon dictation along with smaller phrase technology. Any transcriptional errors that result from this process are unintentional.         Marylene Land, NP 05/30/18 1110

## 2018-05-30 NOTE — ED Triage Notes (Signed)
Patient in today c/o cough and sinus drainage x 9 days. Patient denies fever. Patient has tried OTC Claritin and Delsym.

## 2018-05-30 NOTE — Discharge Instructions (Addendum)
Take medication as prescribed. Rest. Drink plenty of fluids.  ° °Follow up with your primary care physician this week as needed. Return to Urgent care for new or worsening concerns.  ° °

## 2018-06-21 ENCOUNTER — Other Ambulatory Visit: Payer: Self-pay | Admitting: Internal Medicine

## 2018-06-21 DIAGNOSIS — Z1231 Encounter for screening mammogram for malignant neoplasm of breast: Secondary | ICD-10-CM

## 2018-06-21 DIAGNOSIS — Z803 Family history of malignant neoplasm of breast: Secondary | ICD-10-CM

## 2018-08-19 ENCOUNTER — Other Ambulatory Visit: Payer: Self-pay | Admitting: Orthopedic Surgery

## 2018-08-19 DIAGNOSIS — M4807 Spinal stenosis, lumbosacral region: Secondary | ICD-10-CM

## 2018-08-19 DIAGNOSIS — G8929 Other chronic pain: Secondary | ICD-10-CM

## 2018-08-19 DIAGNOSIS — M5441 Lumbago with sciatica, right side: Principal | ICD-10-CM

## 2018-08-19 DIAGNOSIS — M5137 Other intervertebral disc degeneration, lumbosacral region: Secondary | ICD-10-CM

## 2018-08-19 DIAGNOSIS — M5440 Lumbago with sciatica, unspecified side: Secondary | ICD-10-CM

## 2018-08-19 DIAGNOSIS — M545 Low back pain, unspecified: Secondary | ICD-10-CM

## 2018-08-19 DIAGNOSIS — M48061 Spinal stenosis, lumbar region without neurogenic claudication: Secondary | ICD-10-CM

## 2018-08-19 DIAGNOSIS — M5442 Lumbago with sciatica, left side: Principal | ICD-10-CM

## 2018-08-26 ENCOUNTER — Ambulatory Visit
Admission: RE | Admit: 2018-08-26 | Discharge: 2018-08-26 | Disposition: A | Discharge status: Home / Self Care | Payer: Medicare Other | Source: Ambulatory Visit | Attending: Orthopedic Surgery | Admitting: Orthopedic Surgery

## 2018-08-26 ENCOUNTER — Other Ambulatory Visit: Payer: Self-pay

## 2018-08-26 ENCOUNTER — Encounter (INDEPENDENT_AMBULATORY_CARE_PROVIDER_SITE_OTHER): Payer: Self-pay

## 2018-08-26 DIAGNOSIS — M48061 Spinal stenosis, lumbar region without neurogenic claudication: Secondary | ICD-10-CM | POA: Diagnosis present

## 2018-08-26 DIAGNOSIS — M5441 Lumbago with sciatica, right side: Secondary | ICD-10-CM | POA: Diagnosis present

## 2018-08-26 DIAGNOSIS — M5442 Lumbago with sciatica, left side: Secondary | ICD-10-CM | POA: Diagnosis present

## 2018-08-26 DIAGNOSIS — M5137 Other intervertebral disc degeneration, lumbosacral region: Secondary | ICD-10-CM | POA: Diagnosis present

## 2018-08-26 DIAGNOSIS — M5440 Lumbago with sciatica, unspecified side: Secondary | ICD-10-CM | POA: Insufficient documentation

## 2018-08-26 DIAGNOSIS — M545 Low back pain, unspecified: Secondary | ICD-10-CM

## 2018-08-26 DIAGNOSIS — M4807 Spinal stenosis, lumbosacral region: Secondary | ICD-10-CM | POA: Insufficient documentation

## 2018-08-26 DIAGNOSIS — G8929 Other chronic pain: Secondary | ICD-10-CM | POA: Diagnosis present

## 2018-09-27 ENCOUNTER — Ambulatory Visit: Payer: Medicare Other

## 2018-11-22 ENCOUNTER — Ambulatory Visit
Admission: RE | Admit: 2018-11-22 | Discharge: 2018-11-22 | Disposition: A | Discharge status: Home / Self Care | Payer: Medicare Other | Source: Ambulatory Visit | Attending: Internal Medicine | Admitting: Internal Medicine

## 2018-11-22 ENCOUNTER — Other Ambulatory Visit: Payer: Self-pay

## 2018-11-22 DIAGNOSIS — Z1231 Encounter for screening mammogram for malignant neoplasm of breast: Secondary | ICD-10-CM

## 2018-11-22 DIAGNOSIS — Z803 Family history of malignant neoplasm of breast: Secondary | ICD-10-CM | POA: Diagnosis not present

## 2019-02-20 ENCOUNTER — Other Ambulatory Visit: Payer: Self-pay

## 2019-02-20 ENCOUNTER — Other Ambulatory Visit
Admission: RE | Admit: 2019-02-20 | Discharge: 2019-02-20 | Disposition: A | Discharge status: Home / Self Care | Payer: Medicare Other | Source: Ambulatory Visit | Attending: Gastroenterology | Admitting: Gastroenterology

## 2019-02-20 DIAGNOSIS — Z20828 Contact with and (suspected) exposure to other viral communicable diseases: Secondary | ICD-10-CM | POA: Diagnosis not present

## 2019-02-20 DIAGNOSIS — Z01812 Encounter for preprocedural laboratory examination: Secondary | ICD-10-CM | POA: Diagnosis present

## 2019-02-21 LAB — SARS CORONAVIRUS 2 (TAT 6-24 HRS): SARS Coronavirus 2: NEGATIVE

## 2019-02-22 ENCOUNTER — Encounter: Payer: Self-pay | Admitting: Anesthesiology

## 2019-02-23 ENCOUNTER — Ambulatory Visit: Payer: Medicare Other | Admitting: Anesthesiology

## 2019-02-23 ENCOUNTER — Other Ambulatory Visit: Payer: Self-pay

## 2019-02-23 ENCOUNTER — Encounter
Admission: RE | Disposition: A | Discharge status: Home / Self Care | Payer: Self-pay | Source: Home / Self Care | Attending: Gastroenterology

## 2019-02-23 ENCOUNTER — Ambulatory Visit
Admission: RE | Admit: 2019-02-23 | Discharge: 2019-02-23 | Disposition: A | Discharge status: Home / Self Care | Payer: Medicare Other | Attending: Gastroenterology | Admitting: Gastroenterology

## 2019-02-23 ENCOUNTER — Encounter: Payer: Self-pay | Admitting: *Deleted

## 2019-02-23 DIAGNOSIS — Z79899 Other long term (current) drug therapy: Secondary | ICD-10-CM | POA: Diagnosis not present

## 2019-02-23 DIAGNOSIS — D120 Benign neoplasm of cecum: Secondary | ICD-10-CM | POA: Diagnosis not present

## 2019-02-23 DIAGNOSIS — Z7982 Long term (current) use of aspirin: Secondary | ICD-10-CM | POA: Insufficient documentation

## 2019-02-23 DIAGNOSIS — Z87891 Personal history of nicotine dependence: Secondary | ICD-10-CM | POA: Diagnosis not present

## 2019-02-23 DIAGNOSIS — E119 Type 2 diabetes mellitus without complications: Secondary | ICD-10-CM | POA: Insufficient documentation

## 2019-02-23 DIAGNOSIS — K5730 Diverticulosis of large intestine without perforation or abscess without bleeding: Secondary | ICD-10-CM | POA: Insufficient documentation

## 2019-02-23 DIAGNOSIS — I10 Essential (primary) hypertension: Secondary | ICD-10-CM | POA: Diagnosis not present

## 2019-02-23 DIAGNOSIS — D122 Benign neoplasm of ascending colon: Secondary | ICD-10-CM | POA: Diagnosis not present

## 2019-02-23 DIAGNOSIS — K626 Ulcer of anus and rectum: Secondary | ICD-10-CM | POA: Insufficient documentation

## 2019-02-23 DIAGNOSIS — Z1211 Encounter for screening for malignant neoplasm of colon: Secondary | ICD-10-CM | POA: Insufficient documentation

## 2019-02-23 DIAGNOSIS — Z794 Long term (current) use of insulin: Secondary | ICD-10-CM | POA: Insufficient documentation

## 2019-02-23 DIAGNOSIS — Z86010 Personal history of colonic polyps: Secondary | ICD-10-CM | POA: Diagnosis not present

## 2019-02-23 DIAGNOSIS — Z7984 Long term (current) use of oral hypoglycemic drugs: Secondary | ICD-10-CM | POA: Insufficient documentation

## 2019-02-23 HISTORY — PX: COLONOSCOPY WITH PROPOFOL: SHX5780

## 2019-02-23 LAB — GLUCOSE, CAPILLARY: Glucose-Capillary: 123 mg/dL — ABNORMAL HIGH (ref 70–99)

## 2019-02-23 SURGERY — COLONOSCOPY WITH PROPOFOL
Anesthesia: General

## 2019-02-23 MED ORDER — PHENYLEPHRINE HCL (PRESSORS) 10 MG/ML IV SOLN
INTRAVENOUS | Status: DC | PRN
  Administered 2019-02-23 (×2): 100 ug via INTRAVENOUS

## 2019-02-23 MED ORDER — PROPOFOL 500 MG/50ML IV EMUL
INTRAVENOUS | Status: AC
  Filled 2019-02-23: qty 50

## 2019-02-23 MED ORDER — PROPOFOL 10 MG/ML IV BOLUS
INTRAVENOUS | Status: DC | PRN
  Administered 2019-02-23: 10 mg via INTRAVENOUS
  Administered 2019-02-23: 40 mg via INTRAVENOUS

## 2019-02-23 MED ORDER — PROPOFOL 500 MG/50ML IV EMUL
INTRAVENOUS | Status: DC | PRN
  Administered 2019-02-23: 150 ug/kg/min via INTRAVENOUS

## 2019-02-23 MED ORDER — SODIUM CHLORIDE 0.9 % IV SOLN
INTRAVENOUS | Status: DC
  Administered 2019-02-23: 1000 mL via INTRAVENOUS
  Administered 2019-02-23: 08:00:00 via INTRAVENOUS

## 2019-02-23 MED ORDER — EPHEDRINE SULFATE 50 MG/ML IJ SOLN
INTRAMUSCULAR | Status: DC | PRN
  Administered 2019-02-23: 10 mg via INTRAVENOUS

## 2019-02-23 NOTE — Anesthesia Preprocedure Evaluation (Signed)
Anesthesia Evaluation  Patient identified by MRN, date of birth, ID band Patient awake    Reviewed: Allergy & Precautions, NPO status , Patient's Chart, lab work & pertinent test results, reviewed documented beta blocker date and time   Airway Mallampati: II  TM Distance: >3 FB     Dental  (+) Chipped   Pulmonary shortness of breath, former smoker,           Cardiovascular hypertension, Pt. on medications      Neuro/Psych    GI/Hepatic GERD  ,  Endo/Other  diabetes, Type 2  Renal/GU      Musculoskeletal  (+) Arthritis ,   Abdominal   Peds  Hematology  (+) anemia ,   Anesthesia Other Findings   Reproductive/Obstetrics                             Anesthesia Physical Anesthesia Plan  ASA: III  Anesthesia Plan: General   Post-op Pain Management:    Induction: Intravenous  PONV Risk Score and Plan:   Airway Management Planned:   Additional Equipment:   Intra-op Plan:   Post-operative Plan:   Informed Consent: I have reviewed the patients History and Physical, chart, labs and discussed the procedure including the risks, benefits and alternatives for the proposed anesthesia with the patient or authorized representative who has indicated his/her understanding and acceptance.       Plan Discussed with: CRNA  Anesthesia Plan Comments:         Anesthesia Quick Evaluation

## 2019-02-23 NOTE — Transfer of Care (Signed)
Immediate Anesthesia Transfer of Care Note  Patient: Gina Carney  Procedure(s) Performed: COLONOSCOPY WITH PROPOFOL (N/A )  Patient Location: PACU  Anesthesia Type:General  Level of Consciousness: awake  Airway & Oxygen Therapy: Patient Spontanous Breathing and Patient connected to nasal cannula oxygen  Post-op Assessment: Report given to RN and Post -op Vital signs reviewed and stable  Post vital signs: Reviewed and stable  Last Vitals:  Vitals Value Taken Time  BP 122/44 02/23/19 0819  Temp 36.1 C 02/23/19 0818  Pulse 92 02/23/19 0822  Resp 19 02/23/19 0822  SpO2 100 % 02/23/19 0822  Vitals shown include unvalidated device data.  Last Pain:  Vitals:   02/23/19 0818  TempSrc: Tympanic  PainSc: 0-No pain         Complications: No apparent anesthesia complications

## 2019-02-23 NOTE — H&P (Signed)
Outpatient short stay form Pre-procedure 02/23/2019 7:33 AM Lollie Sails MD  Primary Physician: Genene Churn, MD  Reason for visit: Colonoscopy  History of present illness: Patient is a 85 year old female presenting today for colonoscopy in regards her personal history of adenomatous colon polyps.  Her last colonoscopy was in 10/11/2015 1 of the tubular adenomas removed was greater than 10 mm in size.  Is returning for recheck.  She tolerated her prep well.  She takes no aspirin or blood thinning agent.  He is listed is taking 81 mg aspirin daily.    Current Facility-Administered Medications:  .  0.9 %  sodium chloride infusion, , Intravenous, Continuous, Lollie Sails, MD, Last Rate: 20 mL/hr at 02/23/19 0730, 1,000 mL at 02/23/19 0730  Medications Prior to Admission  Medication Sig Dispense Refill Last Dose  . amLODipine (NORVASC) 5 MG tablet Take 5 mg by mouth daily. am   02/22/2019 at Unknown time  . aspirin 81 MG tablet Take 81 mg by mouth daily. am   Past Week at Unknown time  . Bilberry, Vaccinium myrtillus, (BILBERRY PO) Take 200 mg by mouth daily. Am   Past Week at Unknown time  . Calcium-Phosphorus-Vitamin D (CITRACAL +D3 PO) Take by mouth 2 (two) times daily. Am and pm   Past Week at Unknown time  . canagliflozin (INVOKANA) 300 MG TABS tablet Take 300 mg by mouth daily before breakfast.   02/22/2019 at Unknown time  . Cholecalciferol (VITAMIN D3) 2000 UNITS TABS Take by mouth daily. breakfast   Past Week at Unknown time  . Chromium-Cinnamon (CINNAMON PLUS CHROMIUM PO) Take 1,000 mg by mouth 2 (two) times daily. Breakfast and dinner   Past Week at Unknown time  . Cyanocobalamin (VITAMIN B-12 PO) Take 2,500 mcg by mouth daily. am   Past Week at Unknown time  . Flaxseed, Linseed, (FLAX SEEDS PO) Take by mouth 2 (two) times daily. Flax,fish,borage am and pm   Past Week at Unknown time  . Garlic [id number] MG CAPS Take by mouth daily. am   Past Week at Unknown time  . Insulin NPH,  Human,, Isophane, (HUMULIN N) 100 UNIT/ML Kiwkpen Inject 8 Units into the skin at bedtime.   Past Week at Unknown time  . loratadine (CLARITIN) 10 MG tablet Take 10 mg by mouth daily as needed.    Past Week at Unknown time  . magnesium oxide (MAG-OX) 400 MG tablet Take 400 mg by mouth daily.   Past Week at Unknown time  . metFORMIN (GLUCOPHAGE) 500 MG tablet Take 1,000 mg by mouth 2 (two) times daily with a meal. Breakfast and dinner   Past Week at Unknown time  . Multiple Vitamins-Minerals (CENTRUM ADULTS PO) Take by mouth daily. breakfast   Past Week at Unknown time  . mupirocin ointment (BACTROBAN) 2 % Apply 1 application topically 3 (three) times daily. 22 g 0 Past Week at Unknown time  . omeprazole (PRILOSEC) 20 MG capsule Take 20 mg by mouth daily. am   Past Week at Unknown time  . pravastatin (PRAVACHOL) 10 MG tablet Take 10 mg by mouth daily. bedtime   Past Week at Unknown time  . tizanidine (ZANAFLEX) 2 MG capsule Take 2 mg by mouth 3 (three) times daily.   Past Week at Unknown time  . vitamin E 400 UNIT capsule Take 400 Units by mouth daily. am   Past Week at Unknown time  . benzonatate (TESSALON PERLES) 100 MG capsule Take 1 capsule (100 mg total)  by mouth 3 (three) times daily as needed for cough. (Patient not taking: Reported on 02/23/2019) 15 capsule 0 Not Taking at Unknown time  . doxycycline (VIBRAMYCIN) 100 MG capsule Take 1 capsule (100 mg total) by mouth 2 (two) times daily. (Patient not taking: Reported on 02/23/2019) 20 capsule 0 Completed Course at Unknown time     Allergies  Allergen Reactions  . Bee Venom Anaphylaxis  . Fosamax [Alendronate Sodium]   . Avandia [Rosiglitazone] Swelling    Lower extremity edema  . Caffeine Other (See Comments)    disconnected feeling palpitations  . Codeine Other (See Comments)    Disconnected feeling  . Premarin [Conjugated Estrogens] Swelling and Rash  . Singulair [Montelukast Sodium] Other (See Comments)    Disconnected  feeling Dizziness  . Wheat Bran Hives     Past Medical History:  Diagnosis Date  . Anemia    takes B12  . Arthritis    knees  . Chickenpox   . Diabetes mellitus without complication (HCC)    Type 2  . Diverticulitis   . GERD (gastroesophageal reflux disease)   . History of Helicobacter pylori infection   . Hypercholesterolemia   . Hypertension    controlled on meds  . Osteopenia   . Shortness of breath dyspnea   . Sinus trouble     Review of systems:      Physical Exam    Heart and lungs: Regular rate and rhythm without rub or gallop    HEENT: Normocephalic atraumatic eyes are anicteric    Other:    Pertinant exam for procedure: Soft nontender nondistended bowel sounds positive normoactive    Planned proceedures: Colonoscopy and indicated procedures. I have discussed the risks benefits and complications of procedures to include not limited to bleeding, infection, perforation and the risk of sedation and the patient wishes to proceed.    Lollie Sails, MD Gastroenterology 02/23/2019  7:33 AM

## 2019-02-23 NOTE — Anesthesia Postprocedure Evaluation (Signed)
Anesthesia Post Note  Patient: Gina Carney [facility]  Procedure(s) Performed: COLONOSCOPY WITH PROPOFOL (N/A )  Anesthesia Type: General     Last Vitals:  Vitals:   02/23/19 0818 02/23/19 0822  BP: (!) 122/44 106/78  Pulse: (!) 101   Resp: 16   Temp: (!) 36.1 C   SpO2:      Last Pain:  Vitals:   02/23/19 0900  TempSrc:   PainSc: 0-No pain                 Zadok Holaway S

## 2019-02-23 NOTE — Anesthesia Post-op Follow-up Note (Signed)
Anesthesia QCDR form completed.        

## 2019-02-23 NOTE — Anesthesia Procedure Notes (Signed)
Date/Time: 02/23/2019 7:40 AM Performed by: Allean Found, CRNA Pre-anesthesia Checklist: Patient identified, Emergency Drugs available, Suction available, Patient being monitored and Timeout performed Patient Re-evaluated:Patient Re-evaluated prior to induction Oxygen Delivery Method: Nasal cannula Placement Confirmation: positive ETCO2

## 2019-02-23 NOTE — Op Note (Signed)
[facility] Gastroenterology Patient Name: Gina Carney Procedure Date: 02/23/2019 7:32 AM MRN: [id number] Account #: [id number] Date of Birth: 12-Jul-1933 Admit Type: Outpatient Age: 85 Room: [facility] ENDO ROOM 1 Gender: Female Note Status: Finalized Procedure:            Colonoscopy Indications:          Personal history of colonic polyps Providers:            Lollie Sails, MD Referring MD:         Christena Flake. Raechel Ache, MD (Referring MD) Medicines:            Monitored Anesthesia Care Complications:        No immediate complications. Procedure:            Pre-Anesthesia Assessment:                       - ASA Grade Assessment: III - A patient with severe                        systemic disease.                       After obtaining informed consent, the colonoscope was                        passed under direct vision. Throughout the procedure,                        the patient's blood pressure, pulse, and oxygen                        saturations were monitored continuously. The                        Colonoscope was introduced through the anus and                        advanced to the the cecum, identified by appendiceal                        orifice and ileocecal valve. The colonoscopy was                        performed without difficulty. The patient tolerated the                        procedure well. The quality of the bowel preparation                        was good. Findings:      Two sessile polyps were found in the cecum. The polyps were 2 to 3 mm in       size. These polyps were removed with a cold biopsy forceps. Resection       and retrieval were complete.      A 2 mm polyp was found in the proximal ascending colon. The polyp was       sessile. The polyp was removed with a cold biopsy forceps. Resection and       retrieval were complete.      A localized area of mildly erythematous with some surface erosion.  mucosa was found in the  distal rectum. Biopsies were taken with a cold       forceps for histology.      Many small and large-mouthed diverticula were found in the sigmoid       colon, descending colon and transverse colon. Impression:           - Two 2 to 3 mm polyps in the cecum, removed with a                        cold biopsy forceps. Resected and retrieved.                       - One 2 mm polyp in the proximal ascending colon,                        removed with a cold biopsy forceps. Resected and                        retrieved.                       - Erythematous with some surface erosion. mucosa in the                        distal rectum, mild rectal mucosal prolapse. Biopsied.                       - Diverticulosis in the sigmoid colon, in the                        descending colon and in the transverse colon. Recommendation:       - Discharge patient to home.                       - Await pathology results.                       - Use Citrucel one tablespoon PO daily daily.                       - Use Analpram HC Cream 2.5%: Apply externally TID for                        10 days. Procedure Code(s):    --- Professional ---                       [phone], Colonoscopy, flexible; with biopsy, single or                        multiple Diagnosis Code(s):    --- Professional ---                       K63.5, Polyp of colon                       Z86.010, Personal history of colonic polyps                       K57.30, Diverticulosis of large intestine without  perforation or abscess without bleeding CPT copyright 2019 [employer]. All rights reserved. The codes documented in this report are preliminary and upon coder review may  be revised to meet current compliance requirements. Lollie Sails, MD 02/23/2019 8:18:42 AM This report has been signed electronically. Number of Addenda: 0 Note Initiated On: 02/23/2019 7:32 AM Scope Withdrawal Time: 0 hours 12 minutes  20 seconds  Total Procedure Duration: 0 hours 23 minutes 23 seconds       [facility]

## 2019-02-24 ENCOUNTER — Encounter: Payer: Self-pay | Admitting: Gastroenterology

## 2019-02-28 LAB — SURGICAL PATHOLOGY

## 2019-10-16 ENCOUNTER — Other Ambulatory Visit: Payer: Self-pay | Admitting: Internal Medicine

## 2019-10-16 DIAGNOSIS — Z1231 Encounter for screening mammogram for malignant neoplasm of breast: Secondary | ICD-10-CM

## 2019-11-27 ENCOUNTER — Encounter (INDEPENDENT_AMBULATORY_CARE_PROVIDER_SITE_OTHER): Payer: Self-pay

## 2019-11-27 ENCOUNTER — Ambulatory Visit
Admission: RE | Admit: 2019-11-27 | Discharge: 2019-11-27 | Disposition: A | Discharge status: Home / Self Care | Payer: Medicare Other | Source: Ambulatory Visit | Attending: Internal Medicine | Admitting: Internal Medicine

## 2019-11-27 ENCOUNTER — Other Ambulatory Visit: Payer: Self-pay

## 2019-11-27 DIAGNOSIS — Z1231 Encounter for screening mammogram for malignant neoplasm of breast: Secondary | ICD-10-CM | POA: Diagnosis present

## 2020-04-30 ENCOUNTER — Other Ambulatory Visit: Payer: Self-pay | Admitting: Internal Medicine

## 2020-04-30 ENCOUNTER — Other Ambulatory Visit (HOSPITAL_COMMUNITY): Payer: Self-pay | Admitting: Internal Medicine

## 2020-04-30 DIAGNOSIS — K7689 Other specified diseases of liver: Secondary | ICD-10-CM

## 2020-05-14 ENCOUNTER — Ambulatory Visit: Payer: Medicare Other

## 2020-05-15 ENCOUNTER — Ambulatory Visit
Admission: RE | Admit: 2020-05-15 | Discharge: 2020-05-15 | Disposition: A | Discharge status: Home / Self Care | Payer: Medicare Other | Source: Ambulatory Visit | Attending: Internal Medicine | Admitting: Internal Medicine

## 2020-05-15 ENCOUNTER — Other Ambulatory Visit: Payer: Self-pay

## 2020-05-15 DIAGNOSIS — K7689 Other specified diseases of liver: Secondary | ICD-10-CM | POA: Diagnosis not present

## 2020-05-15 MED ORDER — GADOBUTROL 1 MMOL/ML IV SOLN
7.0000 mL | Freq: Once | INTRAVENOUS | Status: AC | PRN
  Administered 2020-05-15: 7 mL via INTRAVENOUS

## 2020-06-26 ENCOUNTER — Other Ambulatory Visit: Payer: Self-pay | Admitting: Internal Medicine

## 2020-06-26 DIAGNOSIS — E7800 Pure hypercholesterolemia, unspecified: Secondary | ICD-10-CM

## 2020-06-26 DIAGNOSIS — K7689 Other specified diseases of liver: Secondary | ICD-10-CM

## 2020-08-13 ENCOUNTER — Other Ambulatory Visit: Payer: Self-pay

## 2020-08-13 ENCOUNTER — Ambulatory Visit
Admission: RE | Admit: 2020-08-13 | Discharge: 2020-08-13 | Disposition: A | Discharge status: Home / Self Care | Payer: Medicare Other | Source: Ambulatory Visit | Attending: Internal Medicine | Admitting: Internal Medicine

## 2020-08-13 DIAGNOSIS — K7689 Other specified diseases of liver: Secondary | ICD-10-CM | POA: Diagnosis present

## 2020-08-13 DIAGNOSIS — E7800 Pure hypercholesterolemia, unspecified: Secondary | ICD-10-CM | POA: Diagnosis present

## 2020-08-13 MED ORDER — GADOBUTROL 1 MMOL/ML IV SOLN: 7.5000 mL | Freq: Once | INTRAVENOUS | Status: DC | PRN

## 2020-08-13 MED ORDER — GADOXETATE DISODIUM 0.25 MMOL/ML IV SOLN
8.0000 mL | Freq: Once | INTRAVENOUS | Status: AC | PRN
  Administered 2020-08-13: 8 mL via INTRAVENOUS

## 2021-01-27 ENCOUNTER — Other Ambulatory Visit: Payer: Self-pay | Admitting: Nurse Practitioner

## 2021-01-27 DIAGNOSIS — Z1231 Encounter for screening mammogram for malignant neoplasm of breast: Secondary | ICD-10-CM

## 2021-04-17 IMAGING — MR MR ABDOMEN WO/W CM
18 series · 48 of 48 positions shown · IV contrast (Eovist)
Comparison: MRI 05/15/2020

CLINICAL DATA: Follow-up liver lesions.

EXAM:
MRI ABDOMEN WITHOUT AND WITH CONTRAST
TECHNIQUE: Multiplanar multisequence MR imaging of the abdomen was performed
both before and after the administration of intravenous contrast.
CONTRAST:  8mL EOVIST GADOXETATE DISODIUM 0.25 MOL/L IV SOLN

[Series 2: T2 · coronal · 6.0mm · 1.19mm/px · 2 of 30 slices shown (1 of 2)]
[im 1/30]
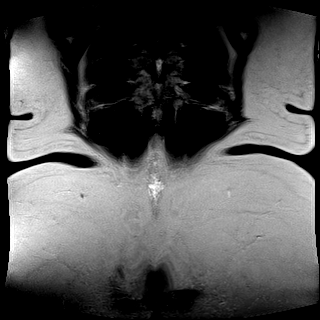
[im 30/30]
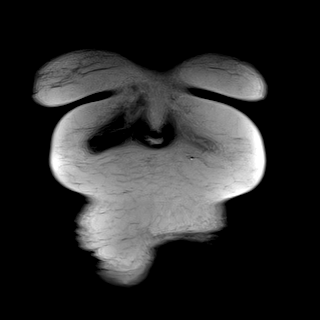

[Series 3: T2 · axial · 6.0mm · 1.19mm/px · z∈[-45,+178]mm · 2 of 32 slices shown (2 of 2)]
[im 1/32]
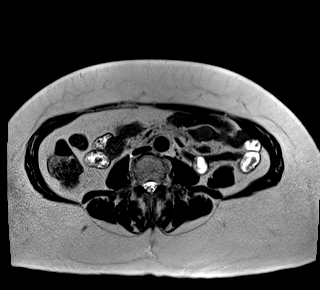
[im 32/32]
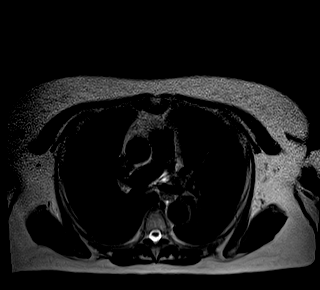

[Series 4: T1 · axial · 3.0mm · 1.19mm/px · z∈[-40,+173]mm · 4 of 72 slices shown (1 of 2)]
[im 1/72]
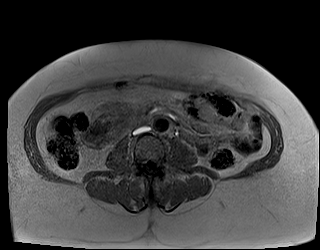
[im 24/72]
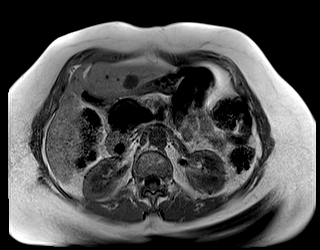
[im 48/72]
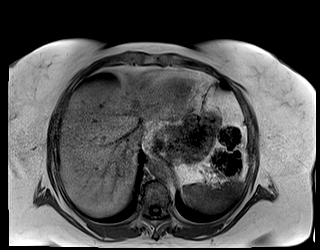
[im 72/72]
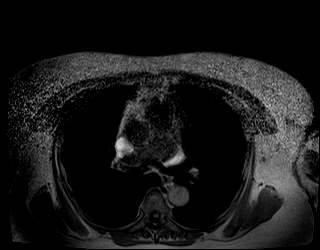

[Series 4: T1 · axial · 3.0mm · 1.19mm/px · z∈[-40,+173]mm · 3 of 72 slices shown (2 of 2)]
[im 1/72]
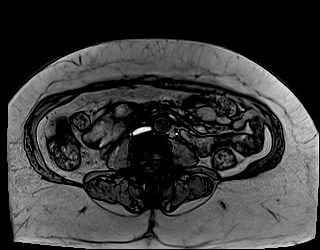
[im 36/72]
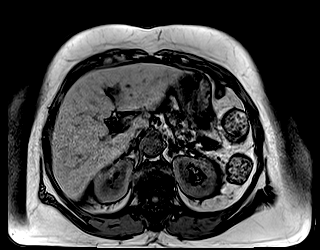
[im 72/72]
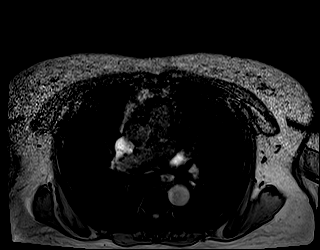

[Series 5: T1 dynamic fat-sat · axial · non-contrast · 3.0mm · 1.19mm/px · z∈[-52,+185]mm · 3 of 80 slices shown (1 of 5)]
[im 1/80]
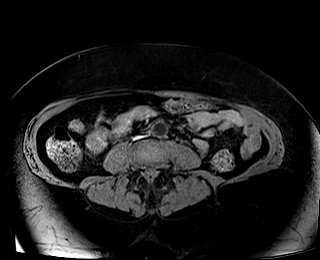
[im 40/80]
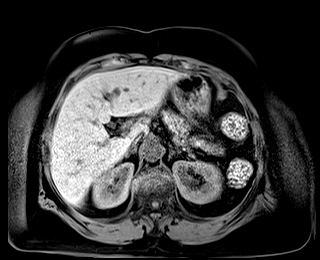
[im 80/80]
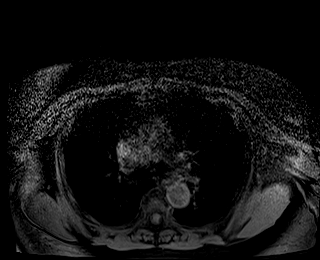

[Series 6: T1 dynamic fat-sat post-contrast · axial · 3.0mm · 1.19mm/px · z∈[-52,+185]mm · 3 of 80 slices shown (1 of 4)]
[im 1/80]
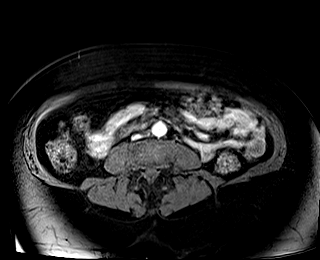
[im 40/80]
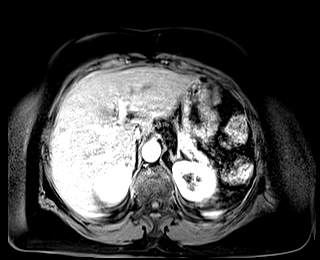
[im 80/80]
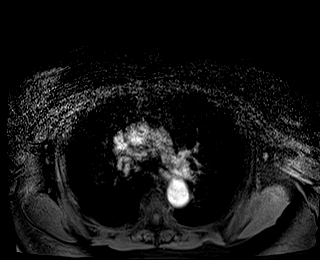

[Series 7: T1 dynamic fat-sat · axial · 3.0mm · 1.19mm/px · z∈[-52,+185]mm · 3 of 80 slices shown (2 of 5)]
[im 1/80]
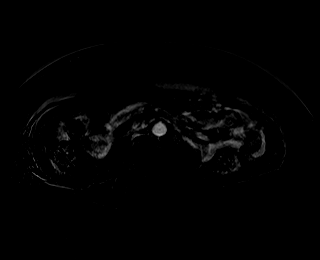
[im 40/80]
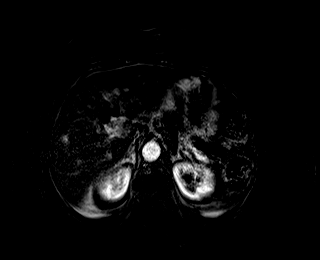
[im 80/80]
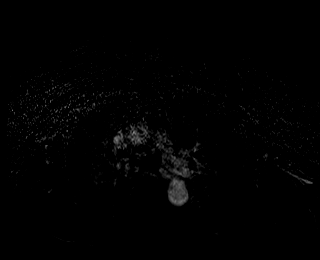

[Series 8: T1 dynamic fat-sat post-contrast · axial · 3.0mm · 1.19mm/px · z∈[-52,+185]mm · 3 of 80 slices shown (2 of 4)]
[im 1/80]
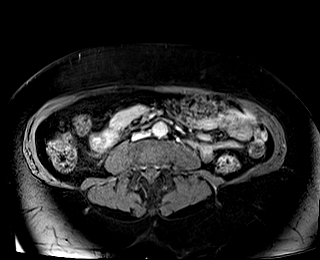
[im 40/80]
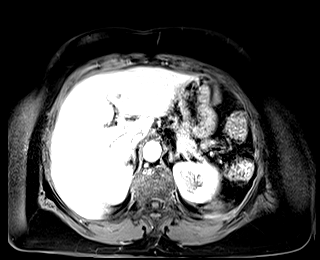
[im 80/80]
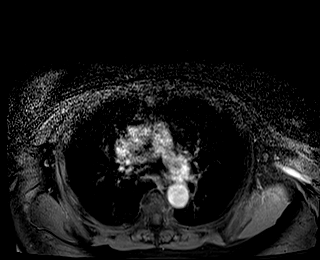

[Series 9: T1 dynamic fat-sat · axial · 3.0mm · 1.19mm/px · z∈[-52,+185]mm · 3 of 80 slices shown (3 of 5)]
[im 1/80]
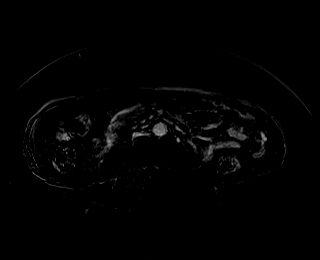
[im 40/80]
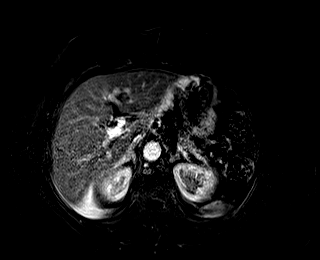
[im 80/80]
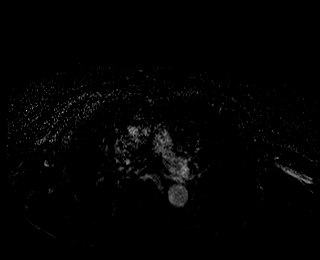

[Series 10: T1 dynamic fat-sat post-contrast · axial · 3.0mm · 1.19mm/px · z∈[-52,+185]mm · 3 of 80 slices shown (3 of 4)]
[im 1/80]
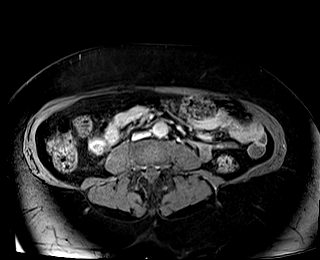
[im 40/80]
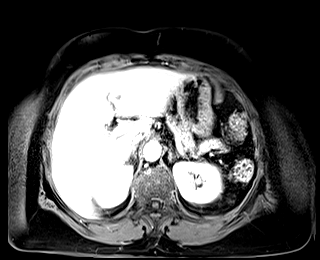
[im 80/80]
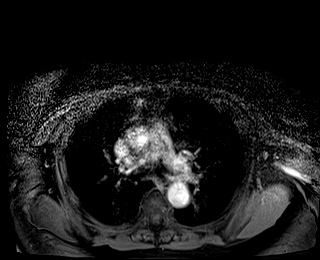

[Series 11: T1 dynamic fat-sat · axial · 3.0mm · 1.19mm/px · z∈[-52,+185]mm · 3 of 80 slices shown (4 of 5)]
[im 1/80]
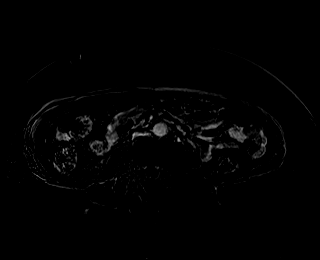
[im 40/80]
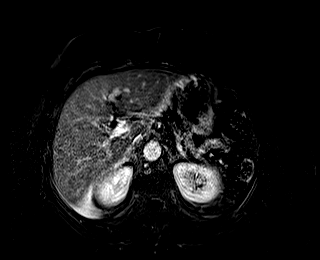
[im 80/80]
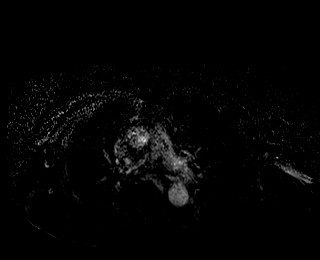

[Series 12: T1 dynamic post-contrast · coronal · 3.0mm · 1.31mm/px · 3 of 72 slices shown]
[im 1/72]
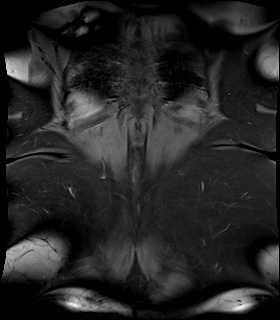
[im 36/72]
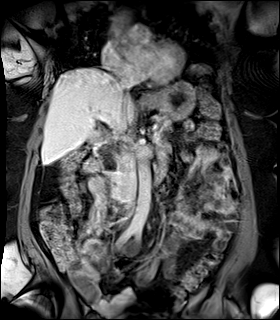
[im 72/72]
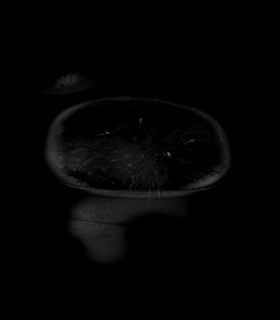

[Series 14: T2 fat-sat · axial · 6.0mm · 1.19mm/px · 1 of 34 slices shown]
[im 1/34]
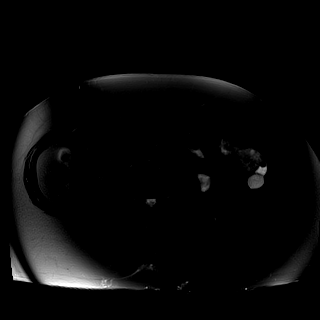

[Series 15: DWI · axial · 6.0mm · 1.42mm/px · z∈[-52,+186]mm · 4 of 102 slices shown (1 of 2)]
[im 1/102]
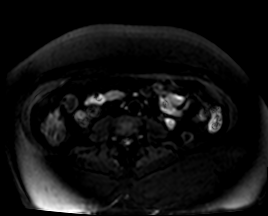
[im 34/102]
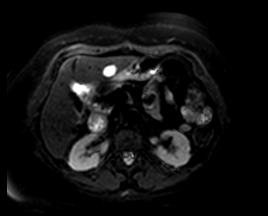
[im 68/102]
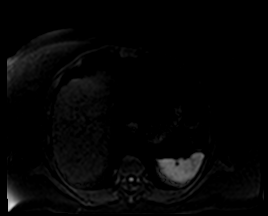
[im 102/102]
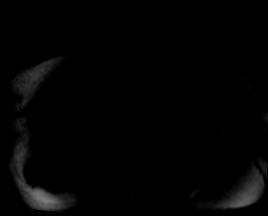

[Series 16: DWI · axial · 6.0mm · 1.42mm/px · 1 of 34 slices shown (2 of 2)]
[im 1/34]
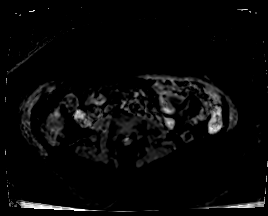

[Series 17: bSSFP · axial · 6.0mm · 0.74mm/px · 1 of 30 slices shown]
[im 1/30]
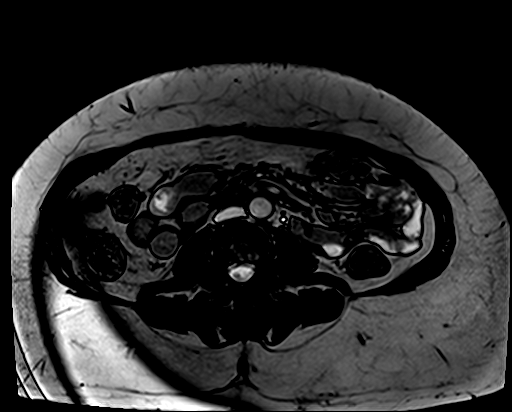

[Series 18: T1 dynamic fat-sat post-contrast · axial · 3.0mm · 1.19mm/px · z∈[-52,+185]mm · 3 of 80 slices shown (4 of 4)]
[im 1/80]
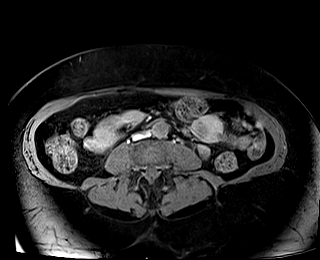
[im 40/80]
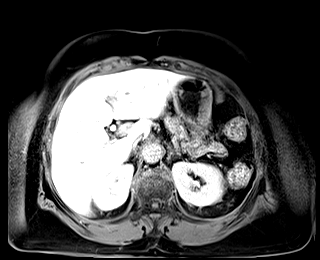
[im 80/80]
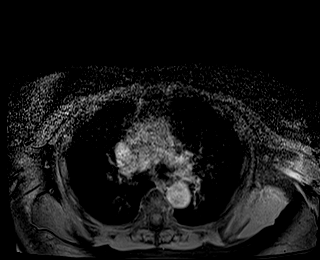

[Series 19: T1 dynamic fat-sat · axial · 3.0mm · 1.19mm/px · z∈[-52,+185]mm · 3 of 80 slices shown (5 of 5)]
[im 1/80]
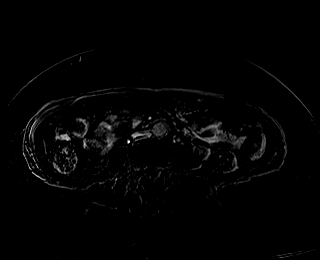
[im 40/80]
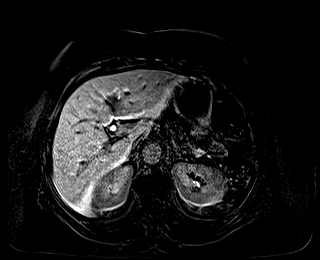
[im 80/80]
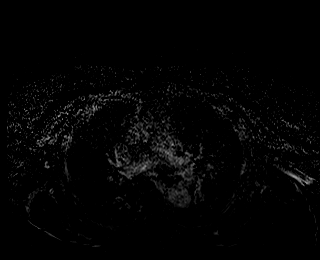

[48 of 48 positions shown; findings below may reference images not displayed]

FINDINGS: Lower chest: The lung bases are clear of an acute process. No
obvious pulmonary lesions. No pleural or pericardial effusion.

Hepatobiliary: Stable segment 3 hemangioma measuring 2 cm.

The only other liver lesion identified on today's study is an
ill-defined area of early arterial phase enhancement in the segment
7 region of the liver peripherally which is most likely some type of
vascular shunt. I do not identify any of the small lesions seen in
the liver on the prior MRI examination. I believe these may be some
type of small vascular shunts. No worrisome hepatic lesions or
intrahepatic biliary dilatation. The gallbladder is surgically
absent. No common bile duct dilatation.

Pancreas: No mass, inflammation or ductal dilatation. Stable large
duodenal diverticulum near the pancreatic head.

Spleen:  Normal size.  No focal lesions.

Adrenals/Urinary Tract: Adrenal glands and kidneys are unremarkable.

Stomach/Bowel: No significant findings.

Vascular/Lymphatic: The aorta and branch vessels are patent. The
major venous structures are patent. No mesenteric or retroperitoneal
mass or adenopathy.

Other:  No ascites or abdominal wall hernia.

Musculoskeletal: No significant bony findings.
IMPRESSION: 1. Stable segment 3 hemangioma measuring 2 cm.
2. The only other liver lesion identified on today's study is an
ill-defined area of early arterial phase enhancement in the segment
7 region of the liver peripherally which is most likely some type of
vascular shunt. No worrisome hepatic lesions or intrahepatic biliary
dilatation. One year follow-up MRI abdomen examination may be
reasonable.
3. Stable large duodenal diverticulum near the pancreatic head.
4. No acute abdominal findings or lymphadenopathy.

## 2023-06-17 ENCOUNTER — Other Ambulatory Visit: Payer: Self-pay | Admitting: Otolaryngology

## 2023-06-22 ENCOUNTER — Encounter: Payer: Self-pay | Admitting: Otolaryngology

## 2023-06-22 ENCOUNTER — Other Ambulatory Visit: Payer: Self-pay

## 2023-06-22 MED ORDER — CIPROFLOXACIN-DEXAMETHASONE 0.3-0.1 % OT SUSP
4.0000 [drp] | Freq: Two times a day (BID) | OTIC | 0 refills | Status: AC
  Filled 2023-06-22: qty 7.5, 19d supply, fill #0

## 2023-06-25 NOTE — Anesthesia Preprocedure Evaluation (Signed)
Anesthesia Evaluation  Patient identified by MRN, date of birth, ID band Patient awake    Reviewed: Allergy & Precautions, NPO status , Patient's Chart, lab work & pertinent test results  Airway Mallampati: II  TM Distance: >3 FB Neck ROM: Full    Dental  (+) Edentulous Upper, Edentulous Lower   Pulmonary neg pulmonary ROS, former smoker   Pulmonary exam normal        Cardiovascular hypertension, +CHF  + dysrhythmias Atrial Fibrillation  Rhythm:Regular Rate:Normal     Neuro/Psych negative neurological ROS  negative psych ROS   GI/Hepatic Neg liver ROS, PUD,GERD  Medicated,,  Endo/Other  diabetes, Type 2, Oral Hypoglycemic Agents    Renal/GU   negative genitourinary   Musculoskeletal  (+) Arthritis ,    Abdominal Normal abdominal exam  (+)   Peds  Hematology  (+) Blood dyscrasia, anemia   Anesthesia Other Findings   Reproductive/Obstetrics                             Anesthesia Physical Anesthesia Plan  ASA: 3  Anesthesia Plan: MAC and Regional   Post-op Pain Management:    Induction: Intravenous  PONV Risk Score and Plan: 1 and Ondansetron, Dexamethasone, Propofol infusion and Treatment may vary due to age or medical condition  Airway Management Planned: Simple Face Mask, Natural Airway and Nasal Cannula  Additional Equipment: None  Intra-op Plan:   Post-operative Plan:   Informed Consent: I have reviewed the patients History and Physical, chart, labs and discussed the procedure including the risks, benefits and alternatives for the proposed anesthesia with the patient or authorized representative who has indicated his/her understanding and acceptance.     Dental advisory given  Plan Discussed with: CRNA  Anesthesia Plan Comments:        Anesthesia Quick Evaluation  

## 2023-06-28 NOTE — Discharge Instructions (Signed)
[facility] DISCHARGE INSTRUCTIONS FOR MYRINGOTOMY AND TUBE INSERTION  [facility] AUSTIN ROSE, M.D.  Diet:   After surgery, the patient should take only liquids and foods as tolerated.  The patient may then have a regular diet after the effects of anesthesia have worn off, usually about four to six hours after surgery.  Activities:   The patient should rest until the effects of anesthesia have worn off.  After this, there are no restrictions on the normal daily activities.  Medications:   You will be given a prescription for antibiotic drops to be used in the ears postoperatively.  It is recommended to use 5 drops 2 times a day for 5 days, then the drops should be saved for possible future use.  The tubes should not cause any discomfort to the patient, but if there is any question, Tylenol should be given according to the instructions for the age of the patient.  Other medications should be continued normally.  Precautions:   Should there be recurrent drainage after the tubes are placed, the drops should be used for approximately 3-4 days.  If it does not clear, you should call the ENT office.  Earplugs:   Earplugs are only needed for those who are going to be submerged under water.  When taking a bath or shower and using a cup or showerhead to rinse hair, it is not necessary to wear earplugs.  These come in a variety of fashions, all of which can be obtained at our office.  However, if one is not able to come by the office, then silicone plugs can be found at most pharmacies.  It is not advised to stick anything in the ear that is not approved as an earplug.  Silly putty is not to be used as an earplug.  Swimming is allowed in patients after ear tubes are inserted, however, they must wear earplugs if they are going to be submerged under water.  For those children who are going to be swimming a lot, it is recommended to use a fitted ear mold, which can be made by  our audiologist.  If discharge is noticed from the ears, this most likely represents an ear infection.  We would recommend getting your eardrops and using them as indicated above.  If it does not clear, then you should call the ENT office.  For follow up, the patient should return to the ENT office three weeks postoperatively and then every six months as required by the doctor.

## 2023-06-30 NOTE — H&P (Signed)
H&P per Dr. Vernie Murders  Chief Complaints: 1. F/U Eustachian tube dysfunction evaluated on May 25, 2023 HPI: This is an 89 year old female who: is following up for eustachian tube dysfunction (Other specified disorders of Eustachian tube, unspecified ear). She was seen on May 25, 2023, at which time the patient was counseled, she was prescribed Prednisone 5 mg tablet (Take 12 day taper. Instructions given in office. 6,6,5,5,4,4,3,3,2,2,1,1.) and The following treatment regimen was given:  Plan: She has fluid in her left middle ear. Will use prednisone and autoinflates. Gave her instructions. She does not tolerate caffeine well. Warned her that prednisone will wind her up. Using a lower dose so she will tolerate better. Check in 3 weeks. The patient presents for further evaluation and management. She reports she tolerated the prednisone, but does not feel like it helped her symptoms. She notes she can still hear her voice in her left ear. She is auto-inflating but has not been able to pop the left ear. 1. Vitals: Date Taken By B.P. Pulse Resp. O2 Sat. Temp. Ht. Wt. BMI BSA 06/15/23 10:14 WILLIFORD, JOSHUA 98.6 F 63.0 in 168.0 lbs 29.8 1.8 FiO2 * Patient Reported Exam: An Otolaryngologic exam was performed Otolaryngologic exam External Ears: external ear examination of normal size and morphology without traumatic or congenital deformity AD, external ear examination of normal size and morphology without traumatic or congenital deformity AS. External ear canal AD: normal EAC AD External ear canal AS: normal EAC AS Tympanic membranes: AD TM: AD tympanic membrane intact, no fluid, normal mobility on pneumotoscopy AS TM: clear effusion; Hearing: AD hearing: normal gross reception to sound and clinical speech recognition, Weber does not lateralize (midline), air conduction greater than bone conduction on Rinne testing AS hearing: left hearing diminished External Nose:  Normal external nasal examination without deformity Right Nasal Cavity: right intranasal examination normal without turbinate hypertrophy, masses, Left nasal cavity: left intranasal examination normal without turbinate hypertrophy, masses, septal Visit Note - June 15, 2023 Gina Carney, Gina Carney MRN: [id number] Phone: [phone] DOB: [birth year] Sex: Female PMS ID: [id number] Vernie Murders, MD (Primary Provider) (Bill Under) Page [phone] Work [phone] Fax [facility] [place] [address] [place], [place] [address] 2025. A focused review of systems was performed including ENT and Mouth and Musculoskeletal and was notable for hearing loss. No Dysphagia, No Loss Of Smell, No Otalgia, No Otorrhea, No Pnd, No Throat Pain, No Tinnitus, No Tongue Swelling, No Neck Mass, And No Neck Pain. Family History Family history: Sister alive with problem (situation) - Other: Breast cancer, hypertension,stroke,diabetes - Sister Medical History Arthritis - Articular system structure Disorder of musculoskeletal system - Other: Osteopenia h/o Diverticular disease Esophageal reflux finding H/O: gastrointestinal disease - Other: H/o H. PYLORI H/O: hypertension Hyperlipidemia Other: diabetes Surgical History Cholecystectomy Other: HYSTERECTOMY septal deformity or synechiae deformity or synechiae Lips, Teeth, Gums: normal lip morphology and anatomy, class I occlusion, no dental abnormalities  Oral cavity/Oropharynx: normal hard and soft palate, tongue, pharyngeal walls, buccal mucosa, floor of mouth, and tonsils Head Inspection: Normal head inspection with normal head shape, without masses or concerning lesions. Ocular Motility: orthophoric in primary gaze and normal ductions and versions OU.  Head Palpation: Normal head inspection without masses, palpable deformities, or concerning lesions. Salivary: Normal inspection of salivary  glands. Facial Strength: Right Facial Strength: I/VI: normal right face muscle tone Left Facial Strength: I/VI: normal left face muscle tone Neck: normal neck examination without skin masses,  tenderness or crepitus  Thyroid: normal thyroid examination without masses or nodules Respiratory Effort: normal respiratory effort without labored breathing or accessory muscle use  Auscultation of Lungs: normal right lung examination without wheezing, rales or rhonchi, normal left lung examination without wheezing, rales or rhonchi. Heart Auscultation: normal heart auscultation without murmur, rub or arrhythmia  Peripheral Vascular System: Normal right neck vascular exam without thrill, aneurysm or exposure, Normal left neck vascular exam without thrill, aneurysm or exposure  Neck Lymph Node: normal lymphatic exam without lymphadenopathy in cranial or cervical regions Neuro - Cranial Nerves: Cranial nerves II-XII intact.  Chest - clear to auscultation bilaterally C/V - regular rate and rhythm without murmur    Appearance: well developed and nourished Communication: normal vocal quality and ability to communicate Orientation: Alert and oriented to person, place, time. Mood:mood and affect well-adjusted, pleasant and cooperative, appropriate for clinical and encounter circumstances Impression/Plan: Eustachian tube dysfunction, Left Other specified disorders of Eustachian tube, left ear (H69.82) 1. Visit Note - June 15, 2023 Gina Carney, Gina Carney MRN: [id number] Phone: [phone] DOB: 1934/03/08 Sex: Female PMS ID: [id number] Vernie Murders, MD (Primary Provider) [facility] Under) Page 3 [phone] Work [phone] Fax [facility] - [place] [address] [place], [place] [address] Status: Inadequately Controlled Plan: Counseling - Eustachian tube dysfunction. Please refer to the education handout for detailed counseling. Plan: Order for  Surgery. Surgery scheduling order Surgeon: Dr Reola Mosher Procedure(s): Left myringotomy and tube requiring general anesthesia; CPT: [phone] Estimated Time: 15 mins. Post-op follow up in: 21 days Facility Needed: [facility] Diagnosis codes: Eustachian tube dysfunction, Left H69.82 Anesthesia: general. Admission Status: outpatient. Risk level: low - Low: Provider: Vernie Murders, MD Priority: normal Plan: Treatment Regimen. Plan: Her left Eustachian tube has remained shut following treatment with prednisone and valsalva maneuvers, and she is failing medical management currently. She has thick fluid filling left middle ear. I believe she is a good candidate to have a PE tube placed in her left TM to help equalize the pressure and drain the fluid in her middle ear. She wants to have this done in the OR. I discussed the procedure, risks, and potential complications with the patient in detail, and she has no further questions. Informed surgical consent signed. Dr Okey Dupre will do the surgery, and he had a chance to meet her today.. Mixed conductive and sensorineural hearing loss, unilateral, left ear, with restricted hearing on the contralateral side Mixed conductive and sensorineural hearing loss, unilateral, left ear with restricted hearing on the contralateral side (H90.A32) Plan: Counseling - Conductive hearing loss, Combined type. Please refer to the education handout for detailed counseling. Plan: Treatment Regimen. Plan: Hearing test showed flat tympanogram left side with 25-30 dB conductive loss left side, on top of bilateral mild to moderate SNHL.

## 2023-07-01 ENCOUNTER — Encounter: Payer: Self-pay | Admitting: Otolaryngology

## 2023-07-01 ENCOUNTER — Ambulatory Visit
Admission: RE | Admit: 2023-07-01 | Discharge: 2023-07-01 | Disposition: A | Discharge status: Home / Self Care | Payer: Medicare Other | Attending: Otolaryngology | Admitting: Otolaryngology

## 2023-07-01 ENCOUNTER — Ambulatory Visit: Payer: Self-pay | Admitting: Anesthesiology

## 2023-07-01 ENCOUNTER — Other Ambulatory Visit: Payer: Self-pay

## 2023-07-01 ENCOUNTER — Encounter
Admission: RE | Disposition: A | Discharge status: Home / Self Care | Payer: Self-pay | Source: Home / Self Care | Attending: Otolaryngology

## 2023-07-01 DIAGNOSIS — H90A32 Mixed conductive and sensorineural hearing loss, unilateral, left ear with restricted hearing on the contralateral side: Secondary | ICD-10-CM | POA: Diagnosis not present

## 2023-07-01 DIAGNOSIS — H6522 Chronic serous otitis media, left ear: Secondary | ICD-10-CM | POA: Diagnosis not present

## 2023-07-01 DIAGNOSIS — E119 Type 2 diabetes mellitus without complications: Secondary | ICD-10-CM

## 2023-07-01 DIAGNOSIS — Z87891 Personal history of nicotine dependence: Secondary | ICD-10-CM | POA: Diagnosis not present

## 2023-07-01 DIAGNOSIS — H6982 Other specified disorders of Eustachian tube, left ear: Secondary | ICD-10-CM | POA: Insufficient documentation

## 2023-07-01 HISTORY — PX: MYRINGOTOMY WITH TUBE PLACEMENT: SHX5663

## 2023-07-01 HISTORY — DX: Presence of external hearing-aid: Z97.4

## 2023-07-01 HISTORY — DX: Presence of dental prosthetic device (complete) (partial): Z97.2

## 2023-07-01 LAB — GLUCOSE, CAPILLARY: Glucose-Capillary: 163 mg/dL — ABNORMAL HIGH (ref 70–99)

## 2023-07-01 SURGERY — MYRINGOTOMY WITH TUBE PLACEMENT
Anesthesia: General | Site: Ear | Laterality: Left

## 2023-07-01 MED ORDER — PROPOFOL 500 MG/50ML IV EMUL
INTRAVENOUS | Status: DC | PRN
  Administered 2023-07-01: 100 mg via INTRAVENOUS

## 2023-07-01 MED ORDER — FENTANYL CITRATE (PF) 100 MCG/2ML IJ SOLN
INTRAMUSCULAR | Status: AC
  Filled 2023-07-01: qty 2

## 2023-07-01 MED ORDER — PROPOFOL 10 MG/ML IV BOLUS
INTRAVENOUS | Status: AC
  Filled 2023-07-01: qty 20

## 2023-07-01 MED ORDER — LACTATED RINGERS IV SOLN: INTRAVENOUS | Status: DC

## 2023-07-01 MED ORDER — MIDAZOLAM HCL 2 MG/2ML IJ SOLN
INTRAMUSCULAR | Status: AC
Start: 2023-07-01 — End: ?
  Filled 2023-07-01: qty 2

## 2023-07-01 MED ORDER — OXYCODONE HCL 5 MG PO TABS: 5.0000 mg | ORAL_TABLET | Freq: Once | ORAL | Status: DC | PRN

## 2023-07-01 MED ORDER — PHENYLEPHRINE HCL (PRESSORS) 10 MG/ML IV SOLN
INTRAVENOUS | Status: DC | PRN
Start: 2023-07-01 — End: 2023-07-01
  Administered 2023-07-01: 100 ug via INTRAVENOUS

## 2023-07-01 MED ORDER — FENTANYL CITRATE PF 50 MCG/ML IJ SOSY: 25.0000 ug | PREFILLED_SYRINGE | INTRAMUSCULAR | Status: DC | PRN

## 2023-07-01 MED ORDER — CIPROFLOXACIN-DEXAMETHASONE 0.3-0.1 % OT SUSP: 4.0000 [drp] | Freq: Two times a day (BID) | OTIC | Status: DC

## 2023-07-01 MED ORDER — OXYCODONE HCL 5 MG/5ML PO SOLN: 5.0000 mg | Freq: Once | ORAL | Status: DC | PRN

## 2023-07-01 MED ORDER — MIDAZOLAM HCL 2 MG/2ML IJ SOLN
INTRAMUSCULAR | Status: DC | PRN
  Administered 2023-07-01: 2 mg via INTRAVENOUS

## 2023-07-01 MED ORDER — ONDANSETRON HCL 4 MG/2ML IJ SOLN
4.0000 mg | Freq: Once | INTRAMUSCULAR | Status: AC | PRN
  Administered 2023-07-01: 4 mg via INTRAVENOUS

## 2023-07-01 MED ORDER — ACETAMINOPHEN 10 MG/ML IV SOLN
1000.0000 mg | Freq: Once | INTRAVENOUS | Status: DC | PRN
Start: 2023-07-01 — End: 2023-07-01

## 2023-07-01 MED ORDER — CIPROFLOXACIN-DEXAMETHASONE 0.3-0.1 % OT SUSP
OTIC | Status: DC | PRN
  Administered 2023-07-01: 4 [drp] via OTIC

## 2023-07-01 SURGICAL SUPPLY — 11 items
BLADE MYR LANCE NRW W/HDL (BLADE) ×2 IMPLANT
CANISTER SUCT 1200ML W/VALVE (MISCELLANEOUS) ×2 IMPLANT
COTTONBALL LRG STERILE PKG (GAUZE/BANDAGES/DRESSINGS) ×2 IMPLANT
GAUZE SPONGE 4X4 12PLY STRL (GAUZE/BANDAGES/DRESSINGS) ×2 IMPLANT
GLOVE PI ULTRA LF STRL 7.5 (GLOVE) ×2 IMPLANT
NS IRRIG 500ML POUR BTL (IV SOLUTION) IMPLANT
STRAP BODY AND KNEE 60X3 (MISCELLANEOUS) ×2 IMPLANT
SYR 10ML LL (SYRINGE) IMPLANT
TOWEL OR 17X26 4PK STRL BLUE (TOWEL DISPOSABLE) ×2 IMPLANT
TUBE EAR T 1.27X5.3 BFLY (OTOLOGIC RELATED) IMPLANT
TUBING SUCTION CONN 0.25 STRL (TUBING) ×2 IMPLANT

## 2023-07-01 NOTE — Interval H&P Note (Signed)
History and Physical Interval Note:  07/01/2023 7:33 AM  Gina Carney  has presented today for surgery, with the diagnosis of Chronic otitis media.  The various methods of treatment have been discussed with the patient and family. After consideration of risks, benefits and other options for treatment, the patient has consented to  Procedure(s) with comments: MYRINGOTOMY WITH TUBE PLACEMENT (Left) - Diabetic as a surgical intervention.  The patient's history has been reviewed, patient examined, no change in status, stable for surgery.  I have reviewed the patient's chart and labs.  Questions were answered to the patient's satisfaction.     Reola Mosher S No changes to H&P.   Magnus Ivan. Okey Dupre, MD, MBA, [facility] [place] [facility] [phone]

## 2023-07-01 NOTE — Op Note (Signed)
OPERATIVE REPORT  Attending Physician: Magnus Ivan. Okey Dupre, MD, MBA, FARS    Otolaryngology-Head & Neck Surgery      Preoperative Diagnosis: Recurrent otitis media.  Postoperative Diagnosis: Same.   Procedure(s) Performed:   1. Left Tympanostomy tube insertion  (CPT [phone]) - butterfly tube 2. Use of operating microscope (CPT [phone]) 3. Bilateral EUA ears (CPT [id number])   Teaching Surgeon:  Magnus Ivan. Okey Dupre, MD, MBA, FARS Assistants: None Dictated   Anesthesia:  General with mask ventilation Specimens:  None Drains:  Butterfly PE tube on left Estimated Blood Loss:  None  Operative Findings: Right ear:  Cerumen removed without difficulty - no obvious middle ear effusion Left ear:  Minimal cerumen; left serous middle ear effusion  Procedure: After informed consent was obtained from the patient's parents, the patient was brought from the preoperative holding area to the operating room and placed supine on the operating room table. After smooth induction of general anesthesia with mask ventilation, a timeout was called and all parties were in agreement. The operating microscope was then brought into the field. A speculum was then used to visualize the right external auditory canal. Cerumen was removed using an alligator forceps and the tympanic membrane was ultimately visualized and noted to be within normal limits.    Attention was then turned to completion of the procedure on the contralateral (left) side, which was done in a similar fashion. A speculum was then used to visualize the external auditory canal. Cerumen was removed with an alligator forceps. The tympanic membrane was ultimately visualized and a myringotomy was made in the anterior inferior portion of the tympanic membrane.  Any middle ear fluid / effusion was removed with suction.  A pressure equalization tube was carefully placed on the left and positioned with a pick. Ciprofloxacin drops were then instilled and a cotton ball  placed.  The patient's care was turned over to the Anesthesia team who successfully awakened the patient without event. The patient was transported to the PACU in stable condition. All instrument, sharp and lap counts were correct at the end of the case.   Teaching Surgeon Attestation:  I was present and performed the entire procedure.    Magnus Ivan. Okey Dupre, MD, MBA, [facility] Otolaryngology-Head & Neck Surgery [place] [facility] [phone]

## 2023-07-01 NOTE — Anesthesia Postprocedure Evaluation (Signed)
Anesthesia Post Note  Patient: MITRA NASRALLAH  Procedure(s) Performed: MYRINGOTOMY WITH TUBE PLACEMENT LEFT EAR (Left: Ear) EXAM UNDER ANESTHESIA RIGHT EAR (Ear)  Patient location during evaluation: PACU Anesthesia Type: General Level of consciousness: awake and alert Pain management: pain level controlled Vital Signs Assessment: post-procedure vital signs reviewed and stable Respiratory status: spontaneous breathing, nonlabored ventilation, respiratory function stable and patient connected to nasal cannula oxygen Cardiovascular status: blood pressure returned to baseline and stable Postop Assessment: no apparent nausea or vomiting Anesthetic complications: no   No notable events documented.   Last Vitals:  Vitals:   07/01/23 0815 07/01/23 0829  BP: 122/77 120/76  Pulse: 87 85  Resp: (!) 21 19  Temp: (!) 36.4 C (!) 36.4 C  SpO2: 100% 96%    Last Pain:  Vitals:   07/01/23 0829  PainSc: 0-No pain                 Corinda Gubler

## 2023-07-01 NOTE — Transfer of Care (Signed)
Immediate Anesthesia Transfer of Care Note  Patient: Gina Carney  Procedure(s) Performed: MYRINGOTOMY WITH TUBE PLACEMENT (Left: Ear)  Patient Location: PACU  Anesthesia Type: General  Level of Consciousness: awake, alert  and patient cooperative  Airway and Oxygen Therapy: Patient Spontanous Breathing and Patient connected to supplemental oxygen  Post-op Assessment: Post-op Vital signs reviewed, Patient's Cardiovascular Status Stable, Respiratory Function Stable, Patent Airway and No signs of Nausea or vomiting  Post-op Vital Signs: Reviewed and stable  Complications: No notable events documented.

## 2023-07-02 ENCOUNTER — Encounter: Payer: Self-pay | Admitting: Otolaryngology
